# Patient Record
Sex: Male | Born: 1970 | Race: White | Hispanic: No | Marital: Single | Smoking: Current every day smoker
Health system: Southern US, Community
[De-identification: ages and names within clinical notes are randomized; demographics above are authoritative.]

## PROBLEM LIST (undated history)

## (undated) HISTORY — PX: HERNIA REPAIR: SHX51

## (undated) HISTORY — PX: APPENDECTOMY: SHX54

---

## 2002-05-28 ENCOUNTER — Encounter: Payer: Self-pay | Admitting: *Deleted

## 2002-05-28 ENCOUNTER — Emergency Department (HOSPITAL_COMMUNITY): Admission: EM | Admit: 2002-05-28 | Discharge: 2002-05-28 | Payer: Self-pay | Admitting: *Deleted

## 2002-11-25 ENCOUNTER — Ambulatory Visit (HOSPITAL_BASED_OUTPATIENT_CLINIC_OR_DEPARTMENT_OTHER): Admission: RE | Admit: 2002-11-25 | Discharge: 2002-11-25 | Payer: Self-pay | Admitting: General Surgery

## 2002-11-25 ENCOUNTER — Ambulatory Visit (HOSPITAL_COMMUNITY): Admission: RE | Admit: 2002-11-25 | Discharge: 2002-11-25 | Payer: Self-pay | Admitting: General Surgery

## 2007-10-16 ENCOUNTER — Ambulatory Visit (HOSPITAL_BASED_OUTPATIENT_CLINIC_OR_DEPARTMENT_OTHER): Admission: RE | Admit: 2007-10-16 | Discharge: 2007-10-16 | Payer: Self-pay | Admitting: Orthopedic Surgery

## 2010-07-12 NOTE — Op Note (Signed)
NAMEPREET, PERRIER NO.:  [id number]   MEDICAL RECORD NO.:  [id number]          PATIENT TYPE:  AMB   LOCATION:  DSC                          FACILITY:  [facility]   PHYSICIAN:  Cindee Salt, M.D.       DATE OF BIRTH:  Sep 13, 1970   DATE OF PROCEDURE:  DATE OF DISCHARGE:                               OPERATIVE REPORT   PREOPERATIVE DIAGNOSIS:  Paronychia felon, left thumb.   POSTOPERATIVE DIAGNOSIS:  Paronychia felon, left thumb.   OPERATION:  Incision and drainage, removal of nail plate, and  debridement left thumb.   SURGEON:  Cindee Salt, MD   ASSISTANT:  __________R.N.   ANESTHESIA:  General.   DATE OF OPERATION:  October 16, 2007.   ANESTHESIOLOGIST:  Janetta Hora. Gelene Mink, M.D.   HISTORY:  The patient is a 40 year old male who has a history of an  infection of his left thumb.  This has produced a large bulla.  He feels  that he got something under his nail while at work.  This has been going  out for approximately 6 days and increasing in size.  He has overnight  developed lymphangitis.  He has been on doxycycline and Rocephin with  little response.  He is admitted for incision and drainage and  debridement as dictated by findings.  He is well aware of risks and  complications including persistence of the infection, injury to  arteries, nerves, tendons, incomplete relief of symptoms, and dystrophy.  Preoperative area of the patient is seen.  The extremity was marked with  both the patient and surgeon.   PROCEDURE:  The patient was brought to the operating room where a  general anesthetic was carried out without difficulty under the  direction of Dr. Gelene Mink.  He was prepped using DuraPrep, supine  position, right arm free.  The limb was exsanguinated from the wrist  proximally with an Esmarch bandage.  Tourniquet was placed and the arm  was inflated to 150 mmHg.  The nail plate was removed, decompression of  approximately 10 mL of pus was immediately  encountered.  There was a  sinus tract going down into the pulp from the tip, this area was  debrided.  The bolus was debrided.  The tissue beneath showed  inflammatory response but was intact.  The area of the felon was opened  from the distal wound, this was then copiously irrigated with saline.  After cultures were taken, Vancomycin 1 gram was then given.  Following  complete irrigation, no foreign material was identified or noted.  This  was done until there was no evidence of purulent material.  The wound  was packed open with iodoform gauze in the nail fold into the felon area  and onto the dorsum.  A sterile compressive dressing and splint was  applied.  The  patient tolerated the procedure well, was taken to the recovery room for  observation in satisfactory condition.  He will be discharged home to  return to the [facility] [place] in 2 days on Vicodin, pen VK,  and doxycycline.  ______________________________  Cindee Salt, M.D.     GK/MEDQ  D:  10/16/2007  T:  10/17/2007  Job:  [id number]   cc:   Dr. Earlene Plater

## 2010-07-15 NOTE — Op Note (Signed)
NAME:  George Costa, George Costa NO.:  [id number]   MEDICAL RECORD NO.:  [id number]                   PATIENT TYPE:  AMB   LOCATION:  DSC                                  FACILITY:  [facility]   PHYSICIAN:  Gita Kudo, M.D.              DATE OF BIRTH:  1970/11/09   DATE OF PROCEDURE:  11/25/2002  DATE OF DISCHARGE:                                 OPERATIVE REPORT   OPERATIVE PROCEDURE:  Repair of bilateral direct herniae with Kugel modified  two-layer mesh.   SURGEON:  Gita Kudo, M.D.   ANESTHESIA:  General.   PREOPERATIVE DIAGNOSIS:  Bilateral inguinal herniae.   POSTOPERATIVE DIAGNOSIS:  Bilateral inguinal herniae, direct.   INDICATIONS FOR PROCEDURE:  This 40 year old male with bilateral groin  bulges.  A physical examination confirms herniae, and he comes in for an  elective repair.   FINDINGS:  The patient had large bilateral direct inguinal herniae.  He had  no injury to the cord structures or nerves.   DESCRIPTION OF PROCEDURE:  Under satisfactory general anesthesia, having  received 1.0 g of Ancef preoperatively, the patient's abdomen was prepped  and draped in a standard fashion.  The right side was approached first.  A  total of 30 mL of 0.5% Marcaine was infiltrated on that side during the  procedure.  A transverse incision was made and carried down and through the  external ring and the external oblique.  Bleeders were coagulated or tied  with #3-0 Vicryl.  The cord and its contents were mobilized and dissected  for an indirect sac, and none was found.  Good exposure was obtained with  self-retaining retractors.  The direct hernia was then circumscribed and the  sac removed.  Finger dissection was used to reduce the preperitoneal  contents, and they were held away with a moistened gauze.  The circular  portion of the Kugel mesh was anchored to Cooper's ligament with a #0  Prolene suture, and unfolded laterally and inferiorly.  The  gauze was  removed and the mesh then unfolded superiorly and medially.  A single suture  was used to one of the tabs, to the undersurface of the abdominal wall.  The  mesh lay in excellent position and extended from the pubis to the great  vessels laterally, and was under the inferior epigastrics.  The floor was  then closed over this with a running #0 Prolene suture, taking intermittent  bites of the mesh.  At the ring, the suture was tied, the ends left long,  and the ring was snug.  Then the oval portion of the mesh was anchored at  the internal ring with a #0 Prolene suture.  It was tacked around the  periphery with three #0 Prolene to the internal oblique, the soft tissue  near the pubis, and the inguinal ligament.  A slit was made to go around the  cord structures, and it was brought around the cord structures, and the  tails sutured to each other.  The wound was then lavaged with saline and  closed in layers after hemostasis was obtained with cautery.  A running #2-0  Vicryl approximated the external oblique, interrupted #2-0 Vicryl for the  deep fascia, #3-0 Vicryl for the subcutaneous, and a temporary dressing was  applied.  Then the other side was approached.  The incision, dissection, repair, and  closure were identical.  Following this, Steri-Strips approximated each  wound skin edges, and sterile dressings were applied.  There were no  complications, and the sponge and needle counts were correct.                                                 Gita Kudo, M.D.    MRL/MEDQ  D:  11/25/2002  T:  11/25/2002  Job:  [id number]

## 2013-12-07 ENCOUNTER — Emergency Department (HOSPITAL_COMMUNITY)
Admission: EM | Admit: 2013-12-07 | Discharge: 2013-12-07 | Disposition: A | Discharge status: Home / Self Care | Payer: BC Managed Care – PPO | Attending: Emergency Medicine | Admitting: Emergency Medicine

## 2013-12-07 ENCOUNTER — Encounter (HOSPITAL_COMMUNITY): Payer: Self-pay | Admitting: Emergency Medicine

## 2013-12-07 DIAGNOSIS — Z9889 Other specified postprocedural states: Secondary | ICD-10-CM | POA: Insufficient documentation

## 2013-12-07 DIAGNOSIS — R066 Hiccough: Secondary | ICD-10-CM | POA: Diagnosis present

## 2013-12-07 DIAGNOSIS — Z87891 Personal history of nicotine dependence: Secondary | ICD-10-CM | POA: Insufficient documentation

## 2013-12-07 DIAGNOSIS — Z7982 Long term (current) use of aspirin: Secondary | ICD-10-CM | POA: Diagnosis not present

## 2013-12-07 DIAGNOSIS — Z9081 Acquired absence of spleen: Secondary | ICD-10-CM | POA: Diagnosis not present

## 2013-12-07 DIAGNOSIS — R05 Cough: Secondary | ICD-10-CM | POA: Insufficient documentation

## 2013-12-07 DIAGNOSIS — K219 Gastro-esophageal reflux disease without esophagitis: Secondary | ICD-10-CM

## 2013-12-07 MED ORDER — GI COCKTAIL ~~LOC~~
30.0000 mL | Freq: Once | ORAL | Status: AC
Start: 2013-12-07 — End: 2013-12-07
  Administered 2013-12-07: 30 mL via ORAL
  Filled 2013-12-07: qty 30

## 2013-12-07 MED ORDER — ONDANSETRON 4 MG PO TBDP
4.0000 mg | ORAL_TABLET | Freq: Once | ORAL | Status: AC
  Administered 2013-12-07: 4 mg via ORAL
  Filled 2013-12-07: qty 1

## 2013-12-07 MED ORDER — PANTOPRAZOLE SODIUM 40 MG PO TBEC
40.0000 mg | DELAYED_RELEASE_TABLET | Freq: Once | ORAL | Status: AC
  Administered 2013-12-07: 40 mg via ORAL
  Filled 2013-12-07: qty 1

## 2013-12-07 MED ORDER — CHLORPROMAZINE HCL 25 MG PO TABS: 25.0000 mg | ORAL_TABLET | Freq: Three times a day (TID) | ORAL | Status: DC | PRN

## 2013-12-07 MED ORDER — CHLORPROMAZINE HCL 25 MG PO TABS
25.0000 mg | ORAL_TABLET | Freq: Once | ORAL | Status: AC
Start: 2013-12-07 — End: 2013-12-07
  Administered 2013-12-07: 25 mg via ORAL
  Filled 2013-12-07: qty 1

## 2013-12-07 NOTE — ED Notes (Signed)
Pt arrived to the ED with a complaint of hiccups and a cough.  Pt states that [place] was in [place] over the [place] and returned to the [place] yesterday am and after arriving began to hiccup.  Pt states that he also has experienced a cough for a couple of days.  Pt states hiccups have subsided after eating some peanut butter.

## 2013-12-07 NOTE — ED Notes (Signed)
MD at bedside. 

## 2013-12-07 NOTE — Discharge Instructions (Signed)
Hiccups A hiccup is the result of a sudden shortening of the muscle below your lungs (diaphragm). This movement of your diaphragm is then followed by the closing of your vocal cords, which causes the hiccup sound. Most people get the hiccups. Typically, hiccups last only a short amount of time. There are three types of hiccups:   Benign: last less than 48 hours.  Persistent: last more than 48 hours, but less than 1 month.  Intractable: last more than 1 month. A hiccup is a reflex. You cannot control reflexes. CAUSES  Causes of the hiccups can include:   Eating too much.  Drinking too much alcohol or fizzy drinks.  Eating too fast.  Eating or drinking hot and spicy foods or drinks.  Using certain medicines that have hiccupping as a side effect. Several medical conditions may also cause hiccups, including, but not limited to:  Stroke.  Gastroesophageal reflux.  Multiple sclerosis.  Traumatic brain injury.  Brain tumor.  Meningitis.  Having damage to the nerve that affects the diaphragm. Usually, though, hiccups have no apparent cause and are not the result of a serious medical condition. DIAGNOSIS  Tests may be performed to diagnose a possible condition associated with persistent or intractable hiccups. TREATMENT  Most cases of the hiccups need no treatment. None of the numerous home remedies have been proven to be effective. If your hiccups do require treatment, your treatment may include:  Medicine. Medicine may be given intravenously (by IV) or by mouth.  Hypnosis or acupuncture.  Surgery to the nerve that affects the diaphragm may be tried in severe cases. If your hiccups are caused by an underlying medical condition, treatment for the medical condition may be necessary.  HOME CARE INSTRUCTIONS   Eat small meals.  Limit alcohol intake to no more than 1 drink per day for nonpregnant women and 2 drinks per day for men. One drink equals 12 ounces of beer, 5 ounces  of wine, or 1 ounces of hard liquor.  Limit drinking fizzy drinks.  Eat and chew your food slowly.  Take medicines only as directed by your health care provider. SEEK MEDICAL CARE IF:   Your hiccups last for more than 48 hours.  You are given medicine, but your hiccups do not get better.  You cannot sleep or eat due to the hiccups.  You have unexpected weight loss due to the hiccups.  You have trouble breathing or swallowing.  You have a fever.  You develop severe pain in your abdomen.  You develop numbness, tingling, or weakness. Document Released: 04/24/2001 Document Revised: 06/30/2013 Document Reviewed: 04/06/2010 [facility] Patient Information 2015 [place], [place]. This information is not intended to replace advice given to you by your health care provider. Make sure you discuss any questions you have with your health care provider.  Gastroesophageal Reflux Disease, Adult Gastroesophageal reflux disease (GERD) happens when acid from your stomach flows up into the esophagus. When acid comes in contact with the esophagus, the acid causes soreness (inflammation) in the esophagus. Over time, GERD may create small holes (ulcers) in the lining of the esophagus. CAUSES   Increased body weight. This puts pressure on the stomach, making acid rise from the stomach into the esophagus.  Smoking. This increases acid production in the stomach.  Drinking alcohol. This causes decreased pressure in the lower esophageal sphincter (valve or ring of muscle between the esophagus and stomach), allowing acid from the stomach into the esophagus.  Late evening meals and a full stomach.  This increases pressure and acid production in the stomach.  A malformed lower esophageal sphincter. Sometimes, no cause is found. SYMPTOMS   Burning pain in the lower part of the mid-chest behind the breastbone and in the mid-stomach area. This may occur twice a week or more often.  Trouble swallowing.  Sore  throat.  Dry cough.  Asthma-like symptoms including chest tightness, shortness of breath, or wheezing. DIAGNOSIS  Your caregiver may be able to diagnose GERD based on your symptoms. In some cases, X-rays and other tests may be done to check for complications or to check the condition of your stomach and esophagus. TREATMENT  Your caregiver may recommend over-the-counter or prescription medicines to help decrease acid production. Ask your caregiver before starting or adding any new medicines.  HOME CARE INSTRUCTIONS   Change the factors that you can control. Ask your caregiver for guidance concerning weight loss, quitting smoking, and alcohol consumption.  Avoid foods and drinks that make your symptoms worse, such as:  Caffeine or alcoholic drinks.  Chocolate.  Peppermint or mint flavorings.  Garlic and onions.  Spicy foods.  Citrus fruits, such as oranges, lemons, or limes.  Tomato-based foods such as sauce, chili, salsa, and pizza.  Fried and fatty foods.  Avoid lying down for the 3 hours prior to your bedtime or prior to taking a nap.  Eat small, frequent meals instead of large meals.  Wear loose-fitting clothing. Do not wear anything tight around your waist that causes pressure on your stomach.  Raise the head of your bed 6 to 8 inches with wood blocks to help you sleep. Extra pillows will not help.  Only take over-the-counter or prescription medicines for pain, discomfort, or fever as directed by your caregiver.  Do not take aspirin, ibuprofen, or other nonsteroidal anti-inflammatory drugs (NSAIDs). SEEK IMMEDIATE MEDICAL CARE IF:   You have pain in your arms, neck, jaw, teeth, or back.  Your pain increases or changes in intensity or duration.  You develop nausea, vomiting, or sweating (diaphoresis).  You develop shortness of breath, or you faint.  Your vomit is green, yellow, black, or looks like coffee grounds or blood.  Your stool is red, bloody, or  black. These symptoms could be signs of other problems, such as heart disease, gastric bleeding, or esophageal bleeding. MAKE SURE YOU:   Understand these instructions.  Will watch your condition.  Will get help right away if you are not doing well or get worse. Document Released: 11/23/2004 Document Revised: 05/08/2011 Document Reviewed: 09/02/2010 [facility] Patient Information 2015 [place], [place]. This information is not intended to replace advice given to you by your health care provider. Make sure you discuss any questions you have with your health care provider.

## 2013-12-07 NOTE — ED Provider Notes (Signed)
CSN: [id number]     Arrival date & time 12/07/13  0009 History   First MD Initiated Contact with Patient 12/07/13 0204     Chief Complaint  Patient presents with  . Cough  . Hiccups     (Consider location/radiation/quality/duration/timing/severity/associated sxs/prior Treatment) HPI  This a 43 year old male with history of reflux who presents with hiccups and cough. Patient reports onset of symptoms and worsening since this morning. Patient states that he ate breakfast and felt like breakfast got stuck. Since that time he has had hiccups that come and go. He reports that he is unable to sleep because of the hiccups. He also reports worsening acid reflux symptoms including nausea.  The patient returned from [place]. He reports that since yesterday he has had a nonproductive cough. Denies any fevers, chest pain, shortness of breath.  History reviewed. No pertinent past medical history. Past Surgical History  Procedure Laterality Date  . Appendectomy    . Hernia repair     History reviewed. No pertinent family history. History  Substance Use Topics  . Smoking status: Former Games developermoker  . Smokeless tobacco: Never Used  . Alcohol Use: Yes    Review of Systems  Constitutional: Negative.  Negative for fever.  Respiratory: Positive for cough. Negative for chest tightness and shortness of breath.   Cardiovascular: Negative.  Negative for chest pain.  Gastrointestinal: Positive for nausea. Negative for abdominal pain and diarrhea.  Genitourinary: Negative.  Negative for dysuria.  Neurological: Negative for headaches.  All other systems reviewed and are negative.     Allergies  Review of patient's allergies indicates no known allergies.  Home Medications   Prior to Admission medications   Medication Sig Start Date End Date Taking? Authorizing Provider  testosterone cypionate (DEPOTESTOTERONE CYPIONATE) 100 MG/ML injection Inject 100 mg into the muscle every 7 (seven) days. For IM  use only   Yes Historical Provider, MD  chlorproMAZINE (THORAZINE) 25 MG tablet Take 1 tablet (25 mg total) by mouth 3 (three) times daily as needed for hiccoughs. 12/07/13   Shon Batonourtney F Horton, MD   BP 128/79  Pulse 83  Temp(Src) 98.6 F (37 C) (Oral)  Resp 20  SpO2 98% Physical Exam  Nursing note and vitals reviewed. Constitutional: He is oriented to person, place, and time. He appears well-developed and well-nourished. No distress.  HENT:  Head: Normocephalic and atraumatic.  Cardiovascular: Normal rate, regular rhythm and normal heart sounds.   No murmur heard. Pulmonary/Chest: Effort normal and breath sounds normal. No respiratory distress. He has no wheezes.  Abdominal: Soft. Bowel sounds are normal. There is no tenderness. There is no rebound and no guarding.  Musculoskeletal: He exhibits no edema.  Neurological: He is alert and oriented to person, place, and time.  Skin: Skin is warm and dry.  Psychiatric: He has a normal mood and affect.    ED Course  Procedures (including critical care time) Labs Review Labs Reviewed - No data to display  Imaging Review No results found.   EKG Interpretation None      MDM   Final diagnoses:  Hiccups  Gastroesophageal reflux disease, esophagitis presence not specified    Patient presents with hiccups, worsening GERD symptoms, and cough. He is nontoxic on exam. He is noted to be hiccuping. Patient was given Thorazine. He was also given a dose of oral Protonix and a GI cocktail. Patient does not have any fever or shortness of breath and has clear lung sounds. Low suspicion the cough is  related to pneumonia or other pulmonary pathology. May be related to worsening GERD symptoms. On recheck, patient reports improvement of symptoms. Discussed with patient continuing over-the-counter PPI at home. He will also be given Thorazine 3 times a day when necessary for hiccups.  After history, exam, and medical workup I feel the patient has  been appropriately medically screened and is safe for discharge home. Pertinent diagnoses were discussed with the patient. Patient was given return precautions.     Shon Batonourtney F Horton, MD 12/07/13 [phone]

## 2015-03-18 ENCOUNTER — Encounter (HOSPITAL_BASED_OUTPATIENT_CLINIC_OR_DEPARTMENT_OTHER): Payer: Self-pay | Admitting: Emergency Medicine

## 2015-03-18 ENCOUNTER — Emergency Department (HOSPITAL_BASED_OUTPATIENT_CLINIC_OR_DEPARTMENT_OTHER)
Admission: EM | Admit: 2015-03-18 | Discharge: 2015-03-18 | Disposition: A | Discharge status: Home / Self Care | Payer: BLUE CROSS/BLUE SHIELD | Attending: Emergency Medicine | Admitting: Emergency Medicine

## 2015-03-18 ENCOUNTER — Emergency Department (HOSPITAL_BASED_OUTPATIENT_CLINIC_OR_DEPARTMENT_OTHER): Payer: BLUE CROSS/BLUE SHIELD

## 2015-03-18 DIAGNOSIS — F17200 Nicotine dependence, unspecified, uncomplicated: Secondary | ICD-10-CM | POA: Diagnosis not present

## 2015-03-18 DIAGNOSIS — Z9049 Acquired absence of other specified parts of digestive tract: Secondary | ICD-10-CM | POA: Insufficient documentation

## 2015-03-18 DIAGNOSIS — R066 Hiccough: Secondary | ICD-10-CM | POA: Insufficient documentation

## 2015-03-18 DIAGNOSIS — K219 Gastro-esophageal reflux disease without esophagitis: Secondary | ICD-10-CM | POA: Insufficient documentation

## 2015-03-18 DIAGNOSIS — Z9889 Other specified postprocedural states: Secondary | ICD-10-CM | POA: Insufficient documentation

## 2015-03-18 DIAGNOSIS — R05 Cough: Secondary | ICD-10-CM | POA: Diagnosis present

## 2015-03-18 MED ORDER — PANTOPRAZOLE SODIUM 40 MG PO TBEC
40.0000 mg | DELAYED_RELEASE_TABLET | Freq: Once | ORAL | Status: AC
  Administered 2015-03-18: 40 mg via ORAL
  Filled 2015-03-18: qty 1

## 2015-03-18 MED ORDER — CHLORPROMAZINE HCL 10 MG PO TABS: 10.0000 mg | ORAL_TABLET | Freq: Three times a day (TID) | ORAL | Status: DC | PRN

## 2015-03-18 MED ORDER — BENZONATATE 100 MG PO CAPS
200.0000 mg | ORAL_CAPSULE | Freq: Once | ORAL | Status: AC
  Administered 2015-03-18: 200 mg via ORAL
  Filled 2015-03-18: qty 2

## 2015-03-18 MED ORDER — GI COCKTAIL ~~LOC~~
30.0000 mL | Freq: Once | ORAL | Status: AC
  Administered 2015-03-18: 30 mL via ORAL
  Filled 2015-03-18: qty 30

## 2015-03-18 NOTE — ED Notes (Signed)
to xray

## 2015-03-18 NOTE — ED Notes (Signed)
Return from xray

## 2015-03-18 NOTE — ED Provider Notes (Signed)
CSN: [id number]     Arrival date & time 03/18/15  0334 History   None    Chief Complaint  Patient presents with  . Cough     (Consider location/radiation/quality/duration/timing/severity/associated sxs/prior Treatment) Patient is a 45 y.o. male presenting with cough. The history is provided by the patient.  Cough Cough characteristics: hiccups not cough. Severity:  Moderate Onset quality:  Gradual Timing:  Constant Progression:  Unchanged Chronicity:  Recurrent Smoker: yes   Context: not with activity   Relieved by:  None tried Worsened by:  Nothing tried Ineffective treatments:  None tried Associated symptoms: no sore throat, no weight loss and no wheezing   Risk factors: no recent travel   Symptoms started post chili dogs and tom yum soup  History reviewed. No pertinent past medical history. Past Surgical History  Procedure Laterality Date  . Appendectomy    . Hernia repair     History reviewed. No pertinent family history. Social History  Substance Use Topics  . Smoking status: Current Every Day Smoker  . Smokeless tobacco: Never Used  . Alcohol Use: Yes    Review of Systems  Constitutional: Negative for weight loss.  HENT: Negative for congestion, drooling and sore throat.   Respiratory: Positive for cough. Negative for wheezing.   All other systems reviewed and are negative.     Allergies  Review of patient's allergies indicates no known allergies.  Home Medications   Prior to Admission medications   Medication Sig Start Date End Date Taking? Authorizing Provider  chlorproMAZINE (THORAZINE) 25 MG tablet Take 1 tablet (25 mg total) by mouth 3 (three) times daily as needed for hiccoughs. 12/07/13   Shon Baton, MD  testosterone cypionate (DEPOTESTOTERONE CYPIONATE) 100 MG/ML injection Inject 100 mg into the muscle every 7 (seven) days. For IM use only    Historical Provider, MD   BP 143/98 mmHg  Pulse 78  Temp(Src) 97.5 F (36.4 C) (Oral)  Resp  18  SpO2 98% Physical Exam  Constitutional: He is oriented to person, place, and time. He appears well-developed and well-nourished. No distress.  HENT:  Head: Normocephalic and atraumatic.  Mouth/Throat: Oropharynx is clear and moist.  Eyes: Conjunctivae are normal. Pupils are equal, round, and reactive to light.  Neck: Normal range of motion. Neck supple.  Cardiovascular: Normal rate, regular rhythm and intact distal pulses.   Pulmonary/Chest: Effort normal and breath sounds normal. No stridor. No respiratory distress. He has no wheezes. He has no rales.  Abdominal: Soft. Bowel sounds are increased. There is no tenderness. There is no rebound, no guarding, no tenderness at McBurney's point and negative Murphy's sign.  Musculoskeletal: Normal range of motion.  Neurological: He is alert and oriented to person, place, and time.  Skin: Skin is warm and dry.  Psychiatric: He has a normal mood and affect.    ED Course  Procedures (including critical care time) Labs Review Labs Reviewed - No data to display  Imaging Review No results found. I have personally reviewed and evaluated these images and lab results as part of my medical decision-making.   EKG Interpretation None      MDM   Final diagnoses:  None    Patient drove to the ED.  Cannot get thorazine and dry due to sedation.  Will write RX for same.     Cy Blamer, MD 03/18/15 [phone]

## 2015-06-08 ENCOUNTER — Ambulatory Visit
Admission: RE | Admit: 2015-06-08 | Discharge: 2015-06-08 | Disposition: A | Discharge status: Home / Self Care | Payer: BLUE CROSS/BLUE SHIELD | Source: Ambulatory Visit | Attending: Family | Admitting: Family

## 2015-06-08 ENCOUNTER — Other Ambulatory Visit: Payer: Self-pay | Admitting: Family

## 2015-06-08 DIAGNOSIS — R059 Cough, unspecified: Secondary | ICD-10-CM

## 2015-06-08 DIAGNOSIS — R05 Cough: Secondary | ICD-10-CM

## 2015-06-08 DIAGNOSIS — R0989 Other specified symptoms and signs involving the circulatory and respiratory systems: Secondary | ICD-10-CM

## 2017-03-24 ENCOUNTER — Other Ambulatory Visit: Payer: Self-pay | Admitting: Orthopedic Surgery

## 2017-03-24 DIAGNOSIS — M25511 Pain in right shoulder: Secondary | ICD-10-CM

## 2017-04-08 ENCOUNTER — Ambulatory Visit
Admission: RE | Admit: 2017-04-08 | Discharge: 2017-04-08 | Disposition: A | Discharge status: Home / Self Care | Payer: 59 | Source: Ambulatory Visit | Attending: Orthopedic Surgery | Admitting: Orthopedic Surgery

## 2017-04-08 DIAGNOSIS — M25511 Pain in right shoulder: Secondary | ICD-10-CM

## 2017-08-01 ENCOUNTER — Inpatient Hospital Stay (HOSPITAL_COMMUNITY): Payer: No Typology Code available for payment source

## 2017-08-01 ENCOUNTER — Other Ambulatory Visit: Payer: Self-pay

## 2017-08-01 ENCOUNTER — Inpatient Hospital Stay (HOSPITAL_COMMUNITY)
Admission: EM | Admit: 2017-08-01 | Discharge: 2017-08-27 | DRG: 091 | Disposition: E | Payer: No Typology Code available for payment source | Attending: Emergency Medicine | Admitting: Emergency Medicine

## 2017-08-01 ENCOUNTER — Encounter (HOSPITAL_COMMUNITY): Payer: Self-pay

## 2017-08-01 ENCOUNTER — Emergency Department (HOSPITAL_COMMUNITY): Payer: No Typology Code available for payment source

## 2017-08-01 DIAGNOSIS — I959 Hypotension, unspecified: Secondary | ICD-10-CM | POA: Diagnosis present

## 2017-08-01 DIAGNOSIS — R7881 Bacteremia: Secondary | ICD-10-CM | POA: Diagnosis present

## 2017-08-01 DIAGNOSIS — R402112 Coma scale, eyes open, never, at arrival to emergency department: Secondary | ICD-10-CM | POA: Diagnosis present

## 2017-08-01 DIAGNOSIS — G931 Anoxic brain damage, not elsewhere classified: Principal | ICD-10-CM | POA: Diagnosis present

## 2017-08-01 DIAGNOSIS — I214 Non-ST elevation (NSTEMI) myocardial infarction: Secondary | ICD-10-CM | POA: Diagnosis present

## 2017-08-01 DIAGNOSIS — E8729 Other acidosis: Secondary | ICD-10-CM | POA: Diagnosis present

## 2017-08-01 DIAGNOSIS — R569 Unspecified convulsions: Secondary | ICD-10-CM

## 2017-08-01 DIAGNOSIS — T401X1A Poisoning by heroin, accidental (unintentional), initial encounter: Secondary | ICD-10-CM | POA: Diagnosis present

## 2017-08-01 DIAGNOSIS — R579 Shock, unspecified: Secondary | ICD-10-CM

## 2017-08-01 DIAGNOSIS — E872 Acidosis: Secondary | ICD-10-CM | POA: Diagnosis present

## 2017-08-01 DIAGNOSIS — R570 Cardiogenic shock: Secondary | ICD-10-CM | POA: Diagnosis present

## 2017-08-01 DIAGNOSIS — R6521 Severe sepsis with septic shock: Secondary | ICD-10-CM | POA: Diagnosis present

## 2017-08-01 DIAGNOSIS — R402312 Coma scale, best motor response, none, at arrival to emergency department: Secondary | ICD-10-CM | POA: Diagnosis present

## 2017-08-01 DIAGNOSIS — R402212 Coma scale, best verbal response, none, at arrival to emergency department: Secondary | ICD-10-CM | POA: Diagnosis present

## 2017-08-01 DIAGNOSIS — J80 Acute respiratory distress syndrome: Secondary | ICD-10-CM | POA: Diagnosis not present

## 2017-08-01 DIAGNOSIS — I469 Cardiac arrest, cause unspecified: Secondary | ICD-10-CM | POA: Diagnosis not present

## 2017-08-01 DIAGNOSIS — N179 Acute kidney failure, unspecified: Secondary | ICD-10-CM | POA: Diagnosis present

## 2017-08-01 DIAGNOSIS — T401X4A Poisoning by heroin, undetermined, initial encounter: Secondary | ICD-10-CM | POA: Diagnosis not present

## 2017-08-01 DIAGNOSIS — A419 Sepsis, unspecified organism: Secondary | ICD-10-CM

## 2017-08-01 DIAGNOSIS — G40101 Localization-related (focal) (partial) symptomatic epilepsy and epileptic syndromes with simple partial seizures, not intractable, with status epilepticus: Secondary | ICD-10-CM | POA: Diagnosis present

## 2017-08-01 DIAGNOSIS — F17200 Nicotine dependence, unspecified, uncomplicated: Secondary | ICD-10-CM | POA: Diagnosis present

## 2017-08-01 DIAGNOSIS — A403 Sepsis due to Streptococcus pneumoniae: Secondary | ICD-10-CM | POA: Diagnosis present

## 2017-08-01 DIAGNOSIS — J9601 Acute respiratory failure with hypoxia: Secondary | ICD-10-CM | POA: Diagnosis not present

## 2017-08-01 DIAGNOSIS — J690 Pneumonitis due to inhalation of food and vomit: Secondary | ICD-10-CM | POA: Diagnosis present

## 2017-08-01 DIAGNOSIS — Z9289 Personal history of other medical treatment: Secondary | ICD-10-CM

## 2017-08-01 DIAGNOSIS — J9600 Acute respiratory failure, unspecified whether with hypoxia or hypercapnia: Secondary | ICD-10-CM | POA: Diagnosis present

## 2017-08-01 DIAGNOSIS — F1910 Other psychoactive substance abuse, uncomplicated: Secondary | ICD-10-CM | POA: Diagnosis present

## 2017-08-01 LAB — POCT I-STAT 3, ART BLOOD GAS (G3+)
Acid-base deficit: 7 mmol/L — ABNORMAL HIGH (ref 0.0–2.0)
Acid-base deficit: 2 mmol/L (ref 0.0–2.0)
BICARBONATE: 22.7 mmol/L (ref 20.0–28.0)
BICARBONATE: 25.9 mmol/L (ref 20.0–28.0)
BICARBONATE: 26.7 mmol/L (ref 20.0–28.0)
O2 Saturation: 70 %
O2 Saturation: 73 %
O2 Saturation: 75 %
PCO2 ART: 54.8 mmHg — AB (ref 32.0–48.0)
PCO2 ART: 49.5 mmHg — AB (ref 32.0–48.0)
PCO2 ART: 49.2 mmHg — AB (ref 32.0–48.0)
PH ART: 7.313 — AB (ref 7.350–7.450)
PH ART: 7.335 — AB (ref 7.350–7.450)
PO2 ART: 36 mmHg — AB (ref 83.0–108.0)
PO2 ART: 37 mmHg — AB (ref 83.0–108.0)
PO2 ART: 39 mmHg — AB (ref 83.0–108.0)
Patient temperature: 33
Patient temperature: 34.4
Patient temperature: 35.5
TCO2: 25 mmol/L (ref 22–32)
TCO2: 28 mmol/L (ref 22–32)
TCO2: 28 mmol/L (ref 22–32)
pH, Arterial: 7.202 — ABNORMAL LOW (ref 7.350–7.450)

## 2017-08-01 LAB — URINALYSIS, ROUTINE W REFLEX MICROSCOPIC
BILIRUBIN URINE: NEGATIVE
Glucose, UA: 50 mg/dL — AB
Ketones, ur: NEGATIVE mg/dL
LEUKOCYTES UA: NEGATIVE
Nitrite: NEGATIVE
PROTEIN: 100 mg/dL — AB
SPECIFIC GRAVITY, URINE: 1.015 (ref 1.005–1.030)
pH: 5 (ref 5.0–8.0)

## 2017-08-01 LAB — RAPID URINE DRUG SCREEN, HOSP PERFORMED
Amphetamines: NOT DETECTED
BARBITURATES: NOT DETECTED
BENZODIAZEPINES: NOT DETECTED
COCAINE: POSITIVE — AB
OPIATES: POSITIVE — AB
TETRAHYDROCANNABINOL: POSITIVE — AB

## 2017-08-01 LAB — I-STAT ARTERIAL BLOOD GAS, ED
ACID-BASE DEFICIT: 20 mmol/L — AB (ref 0.0–2.0)
Acid-base deficit: 9 mmol/L — ABNORMAL HIGH (ref 0.0–2.0)
BICARBONATE: 20.3 mmol/L (ref 20.0–28.0)
Bicarbonate: 10.6 mmol/L — ABNORMAL LOW (ref 20.0–28.0)
O2 SAT: 99 %
O2 Saturation: 76 %
PH ART: 7.06 — AB (ref 7.350–7.450)
TCO2: 12 mmol/L — ABNORMAL LOW (ref 22–32)
TCO2: 22 mmol/L (ref 22–32)
pCO2 arterial: 35.1 mmHg (ref 32.0–48.0)
pCO2 arterial: 47.6 mmHg (ref 32.0–48.0)
pH, Arterial: 7.213 — ABNORMAL LOW (ref 7.350–7.450)
pO2, Arterial: 181 mmHg — ABNORMAL HIGH (ref 83.0–108.0)
pO2, Arterial: 40 mmHg — CL (ref 83.0–108.0)

## 2017-08-01 LAB — GLUCOSE, CAPILLARY
GLUCOSE-CAPILLARY: 94 mg/dL (ref 65–99)
Glucose-Capillary: 168 mg/dL — ABNORMAL HIGH (ref 65–99)

## 2017-08-01 LAB — BASIC METABOLIC PANEL
ANION GAP: 22 — AB (ref 5–15)
BUN: 37 mg/dL — ABNORMAL HIGH (ref 6–20)
CO2: 18 mmol/L — AB (ref 22–32)
Calcium: 7.6 mg/dL — ABNORMAL LOW (ref 8.9–10.3)
Chloride: 104 mmol/L (ref 101–111)
Creatinine, Ser: 4.86 mg/dL — ABNORMAL HIGH (ref 0.61–1.24)
GFR calc Af Amer: 15 mL/min — ABNORMAL LOW (ref >60)
GFR, EST NON AFRICAN AMERICAN: 13 mL/min — AB (ref >60)
GLUCOSE: 85 mg/dL (ref 65–99)
POTASSIUM: 4.5 mmol/L (ref 3.5–5.1)
Sodium: 144 mmol/L (ref 135–145)

## 2017-08-01 LAB — CBC
HCT: 55.5 % — ABNORMAL HIGH (ref 39.0–52.0)
Hemoglobin: 17.2 g/dL — ABNORMAL HIGH (ref 13.0–17.0)
MCH: 30.3 pg (ref 26.0–34.0)
MCHC: 31 g/dL (ref 30.0–36.0)
MCV: 97.9 fL (ref 78.0–100.0)
PLATELETS: 231 10*3/uL (ref 150–400)
RBC: 5.67 MIL/uL (ref 4.22–5.81)
RDW: 12.4 % (ref 11.5–15.5)
WBC: 7.3 10*3/uL (ref 4.0–10.5)

## 2017-08-01 LAB — I-STAT CG4 LACTIC ACID, ED: Lactic Acid, Venous: 12.29 mmol/L (ref 0.5–1.9)

## 2017-08-01 LAB — I-STAT TROPONIN, ED: TROPONIN I, POC: 3.65 ng/mL — AB (ref 0.00–0.08)

## 2017-08-01 LAB — TROPONIN I
Troponin I: 3.03 ng/mL (ref <0.03)
Troponin I: 20.44 ng/mL (ref <0.03)

## 2017-08-01 LAB — LACTIC ACID, PLASMA
LACTIC ACID, VENOUS: 5.5 mmol/L — AB (ref 0.5–1.9)
Lactic Acid, Venous: 7.8 mmol/L (ref 0.5–1.9)

## 2017-08-01 MED ORDER — NOREPINEPHRINE 4 MG/250ML-% IV SOLN
0.0000 ug/min | INTRAVENOUS | Status: DC
  Administered 2017-08-01: 30 ug/min via INTRAVENOUS
  Administered 2017-08-01: 40 ug/min via INTRAVENOUS
  Administered 2017-08-01 (×2): 50 ug/min via INTRAVENOUS
  Administered 2017-08-01: 42 ug/min via INTRAVENOUS
  Filled 2017-08-01 (×8): qty 250

## 2017-08-01 MED ORDER — ORAL CARE MOUTH RINSE
15.0000 mL | OROMUCOSAL | Status: DC
  Administered 2017-08-01 – 2017-08-02 (×8): 15 mL via OROMUCOSAL

## 2017-08-01 MED ORDER — SODIUM CHLORIDE 0.9 % IV SOLN: INTRAVENOUS | Status: DC

## 2017-08-01 MED ORDER — SODIUM BICARBONATE 8.4 % IV SOLN
INTRAVENOUS | Status: AC
  Filled 2017-08-01: qty 100

## 2017-08-01 MED ORDER — CHLORHEXIDINE GLUCONATE 0.12% ORAL RINSE (MEDLINE KIT): 15.0000 mL | Freq: Two times a day (BID) | OROMUCOSAL | Status: DC

## 2017-08-01 MED ORDER — PHENYLEPHRINE HCL-NACL 10-0.9 MG/250ML-% IV SOLN
0.0000 ug/min | INTRAVENOUS | Status: DC
  Administered 2017-08-01: 100 ug/min via INTRAVENOUS
  Filled 2017-08-01 (×4): qty 250

## 2017-08-01 MED ORDER — FENTANYL CITRATE (PF) 100 MCG/2ML IJ SOLN
100.0000 ug | INTRAMUSCULAR | Status: DC | PRN
  Administered 2017-08-01: 100 ug via INTRAVENOUS

## 2017-08-01 MED ORDER — SODIUM BICARBONATE 8.4 % IV SOLN
INTRAVENOUS | Status: AC
  Administered 2017-08-01: 150 meq via INTRAVENOUS
  Filled 2017-08-01: qty 100

## 2017-08-01 MED ORDER — FAMOTIDINE IN NACL 20-0.9 MG/50ML-% IV SOLN
20.0000 mg | Freq: Two times a day (BID) | INTRAVENOUS | Status: DC
  Administered 2017-08-01 – 2017-08-02 (×3): 20 mg via INTRAVENOUS
  Filled 2017-08-01 (×4): qty 50

## 2017-08-01 MED ORDER — VASOPRESSIN 20 UNIT/ML IV SOLN
0.0400 [IU]/min | INTRAVENOUS | Status: DC
  Administered 2017-08-01 – 2017-08-02 (×3): 0.04 [IU]/min via INTRAVENOUS
  Filled 2017-08-01 (×2): qty 2

## 2017-08-01 MED ORDER — SODIUM CHLORIDE 0.9 % IV SOLN
INTRAVENOUS | Status: AC | PRN
  Administered 2017-08-01 (×2): 1000 mL via INTRAVENOUS

## 2017-08-01 MED ORDER — SODIUM BICARBONATE 8.4 % IV SOLN
INTRAVENOUS | Status: AC
  Filled 2017-08-01: qty 150

## 2017-08-01 MED ORDER — MIDAZOLAM HCL 2 MG/2ML IJ SOLN
2.0000 mg | INTRAMUSCULAR | Status: DC | PRN
  Administered 2017-08-01: 2 mg via INTRAVENOUS

## 2017-08-01 MED ORDER — SODIUM CHLORIDE 0.9 % IV SOLN
INTRAVENOUS | Status: DC | PRN
Start: 2017-08-01 — End: 2017-08-02

## 2017-08-01 MED ORDER — NOREPINEPHRINE 16 MG/250ML-% IV SOLN
0.0000 ug/min | INTRAVENOUS | Status: DC
  Administered 2017-08-01: 35 ug/min via INTRAVENOUS
  Administered 2017-08-02: 50 ug/min via INTRAVENOUS
  Administered 2017-08-02: 42 ug/min via INTRAVENOUS
  Filled 2017-08-01 (×5): qty 250

## 2017-08-01 MED ORDER — SODIUM CHLORIDE 0.9 % IV SOLN: 250.0000 mL | INTRAVENOUS | Status: DC | PRN

## 2017-08-01 MED ORDER — DOPAMINE-DEXTROSE 3.2-5 MG/ML-% IV SOLN
0.0000 ug/kg/min | INTRAVENOUS | Status: DC
  Administered 2017-08-01: 5 ug/kg/min via INTRAVENOUS
  Administered 2017-08-01: 15 ug/kg/min via INTRAVENOUS
  Administered 2017-08-02: 20 ug/kg/min via INTRAVENOUS
  Filled 2017-08-01 (×3): qty 250

## 2017-08-01 MED ORDER — SODIUM BICARBONATE 8.4 % IV SOLN
INTRAVENOUS | Status: DC | PRN
  Administered 2017-08-01: 150 meq via INTRAVENOUS

## 2017-08-01 MED ORDER — SODIUM BICARBONATE 8.4 % IV SOLN
INTRAVENOUS | Status: AC
  Filled 2017-08-01: qty 50

## 2017-08-01 MED ORDER — STERILE WATER FOR INJECTION IV SOLN
INTRAVENOUS | Status: DC
  Administered 2017-08-01 – 2017-08-02 (×4): via INTRAVENOUS
  Filled 2017-08-01 (×8): qty 850

## 2017-08-01 MED ORDER — PIPERACILLIN-TAZOBACTAM 3.375 G IVPB
3.3750 g | Freq: Three times a day (TID) | INTRAVENOUS | Status: DC
  Administered 2017-08-01 – 2017-08-02 (×4): 3.375 g via INTRAVENOUS
  Filled 2017-08-01 (×4): qty 50

## 2017-08-01 MED ORDER — SODIUM BICARBONATE 8.4 % IV SOLN
150.0000 meq | Freq: Once | INTRAVENOUS | Status: AC
  Administered 2017-08-01 (×2): 150 meq via INTRAVENOUS

## 2017-08-01 MED ORDER — VANCOMYCIN HCL 10 G IV SOLR
1500.0000 mg | Freq: Once | INTRAVENOUS | Status: AC
  Administered 2017-08-01: 1500 mg via INTRAVENOUS
  Filled 2017-08-01 (×3): qty 1500

## 2017-08-01 MED ORDER — HEPARIN SODIUM (PORCINE) 5000 UNIT/ML IJ SOLN: 5000.0000 [IU] | Freq: Three times a day (TID) | INTRAMUSCULAR | Status: DC

## 2017-08-01 MED ORDER — PHENYLEPHRINE HCL 10 MG/ML IJ SOLN
INTRAMUSCULAR | Status: AC | PRN
  Administered 2017-08-01: 80 ug
  Administered 2017-08-01: 20 ug
  Administered 2017-08-01: 40 ug
  Administered 2017-08-01: 30 ug

## 2017-08-01 MED ORDER — PHENYLEPHRINE 40 MCG/ML (10ML) SYRINGE FOR IV PUSH (FOR BLOOD PRESSURE SUPPORT)
PREFILLED_SYRINGE | INTRAVENOUS | Status: AC
  Filled 2017-08-01: qty 10

## 2017-08-01 MED ORDER — SODIUM CHLORIDE 0.9 % IV SOLN
0.0000 ug/min | INTRAVENOUS | Status: DC
  Administered 2017-08-01: 200 ug/min via INTRAVENOUS
  Administered 2017-08-01: 400 ug/min via INTRAVENOUS
  Administered 2017-08-01: 300 ug/min via INTRAVENOUS
  Administered 2017-08-01: 200 ug/min via INTRAVENOUS
  Administered 2017-08-01 – 2017-08-02 (×7): 400 ug/min via INTRAVENOUS
  Filled 2017-08-01: qty 40
  Filled 2017-08-01: qty 4
  Filled 2017-08-01: qty 40
  Filled 2017-08-01 (×2): qty 4
  Filled 2017-08-01: qty 40
  Filled 2017-08-01: qty 4
  Filled 2017-08-01 (×3): qty 40
  Filled 2017-08-01: qty 4
  Filled 2017-08-01 (×4): qty 40

## 2017-08-01 MED ORDER — FENTANYL CITRATE (PF) 100 MCG/2ML IJ SOLN
100.0000 ug | INTRAMUSCULAR | Status: DC | PRN
  Filled 2017-08-01: qty 2

## 2017-08-01 MED ORDER — PIPERACILLIN-TAZOBACTAM 3.375 G IVPB 30 MIN
3.3750 g | Freq: Once | INTRAVENOUS | Status: AC
  Administered 2017-08-01: 3.375 g via INTRAVENOUS
  Filled 2017-08-01: qty 50

## 2017-08-01 MED ORDER — CHLORHEXIDINE GLUCONATE 0.12% ORAL RINSE (MEDLINE KIT)
15.0000 mL | Freq: Two times a day (BID) | OROMUCOSAL | Status: DC
  Administered 2017-08-01 – 2017-08-02 (×2): 15 mL via OROMUCOSAL

## 2017-08-01 MED ORDER — ORAL CARE MOUTH RINSE
15.0000 mL | OROMUCOSAL | Status: DC
  Administered 2017-08-01 (×2): 15 mL via OROMUCOSAL

## 2017-08-01 MED ORDER — SODIUM BICARBONATE 8.4 % IV SOLN
INTRAVENOUS | Status: AC
  Administered 2017-08-01: 50 meq
  Filled 2017-08-01: qty 50

## 2017-08-01 MED ORDER — MIDAZOLAM HCL 50 MG/10ML IJ SOLN
5.0000 mg/h | INTRAMUSCULAR | Status: DC
  Administered 2017-08-01: 1 mg/h via INTRAVENOUS
  Filled 2017-08-01 (×2): qty 10

## 2017-08-01 MED ORDER — DEXTROSE 5 % IV SOLN: 0.0000 ug/min | Freq: Once | INTRAVENOUS | Status: DC

## 2017-08-01 MED ORDER — MIDAZOLAM HCL 2 MG/2ML IJ SOLN
2.0000 mg | INTRAMUSCULAR | Status: DC | PRN
  Filled 2017-08-01: qty 2

## 2017-08-01 MED ORDER — LEVETIRACETAM IN NACL 1000 MG/100ML IV SOLN
1000.0000 mg | Freq: Two times a day (BID) | INTRAVENOUS | Status: DC
  Administered 2017-08-01 – 2017-08-02 (×2): 1000 mg via INTRAVENOUS
  Filled 2017-08-01 (×3): qty 100

## 2017-08-01 NOTE — Progress Notes (Signed)
ABG results reported to MD, no changes at this time.

## 2017-08-01 NOTE — Progress Notes (Signed)
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 Previously with limited myoclonic jerking but now has generalized tonic/ conic type jerking with concern for status epilepticus.  eICU Interventions  Prolonged EEG, Keppra 1 gm Q 12 hrs, Versed infusion @ 1 mg/ Hr, Neurology consultation, CT brain when stable to transport        Solymar Grace U Aleeza [place] 08/21/2017, 10:38 PM

## 2017-08-01 NOTE — Progress Notes (Signed)
I-stat Blood Gas results showed Panic pH and HCO3 values, results reported to MD at bedside and necessary treatment was ordered.

## 2017-08-01 NOTE — Progress Notes (Signed)
   May 10, 2017 0800  Clinical Encounter Type  Visited With Patient;Family  Visit Type Initial  Referral From Nurse  Consult/Referral To Chaplain  Spiritual Encounters  Spiritual Needs Prayer;Emotional  Stress Factors  Patient Stress Factors Exhausted  Family Stress Factors Exhausted    This was a page for a 47 year old coucasian male who was in the ED trauma C. Pt was a post CPR that proceeded to 2M14. Girlfriend was on-site and very tearful. Pt's girlfriend was very receptive and shared a lot about provided emotional support, compassionate presence and prayer.  Falana Clagg a Water quality scientistMusiko-Holley, E. I. du PontChaplain

## 2017-08-01 NOTE — ED Triage Notes (Signed)
Pt comes via [facility], found unresponsive at hotel after possible heroin use, initial rhythm asystole, PTA given 2mg  Narcan and 3 epi and on epi drip. Pupils fixed dilated. ROSC achieved NSR

## 2017-08-01 NOTE — Procedures (Signed)
Arterial Catheter Insertion Procedure Note George Costa [id number] [birth year]  Procedure: Insertion of Arterial Catheter  Indications: Blood pressure monitoring  Procedure Details Consent: Unable to obtain consent because of emergent medical necessity. Time Out: Verified patient identification, verified procedure, site/side was marked, verified correct patient position, special equipment/implants available, medications/allergies/relevent history reviewed, required imaging and test results available.  Performed  Maximum sterile technique was used including antiseptics, cap, gloves, gown, hand hygiene, mask and sheet. Skin prep: Chlorhexidine; local anesthetic administered 20 gauge catheter was inserted into left radial artery using the Seldinger technique. ULTRASOUND GUIDANCE USED: YES Evaluation Blood flow good; BP tracing good. Complications: No apparent complications.   Melanee Spryelson, Edi Gorniak Lawson 08/01/2017

## 2017-08-01 NOTE — Progress Notes (Signed)
Pharmacy Antibiotic Note  George Costa is a 47 y.o. male admitted on 08/23/2017 s/p CPR.  Pharmacy has been consulted for vancomycin dosing for possible pneumonia. Pt is hypothermic and WBC is WNL. SCr is elevated at 4.86 with unknown baseline. Lactic acid elevated.   Plan: Vancomycin 1500mg  IV x 1 - f/u Scr trend for further doses Zosyn per MD - dose is borderline F/u renal fxn, C&S, clinical status and [facility]/u care plan  Height: 6' (182.9 cm) Weight: 190 lb (86.2 kg)(from Jan 2019 records) IBW/kg (Calculated) : 77.6  Temp (24hrs), Avg:89 F (31.7 C), Min:86.7 F (30.4 C), Max:91.3 F (32.9 C)  Recent Labs  Lab 05-19-17 0545 05-19-17 0553  WBC 7.3  --   CREATININE 4.86*  --   LATICACIDVEN  --  12.29*    Estimated Creatinine Clearance: 20.2 mL/min (A) (by C-G formula based on SCr of 4.86 mg/dL (H)).    No Known Allergies  Antimicrobials this admission: Vanc 6/5>> Zosyn 6/5>>  Dose adjustments this admission: N/A  Microbiology results: Pending  Thank you for allowing pharmacy to be a part of this patient's care.  Sangeeta Youse, Drake LeachRachel Lynn 08/13/2017 7:54 AM

## 2017-08-01 NOTE — Code Documentation (Signed)
CCM at bedside 

## 2017-08-01 NOTE — Progress Notes (Signed)
ANTICOAGULATION CONSULT NOTE - Initial Consult  Pharmacy Consult for heparin Indication: chest pain/ACS  No Known Allergies  Patient Measurements: Height: 6' (182.9 cm) Weight: 190 lb (86.2 kg)(from Jan 2019 records) IBW/kg (Calculated) : 77.6 Heparin Dosing Weight: 86.2kg  Vital Signs: Temp: 91.3 F (32.9 C) (06/05 0551) Temp Source: Rectal (06/05 0551) BP: 58/24 (06/05 0640) Pulse Rate: 61 (06/05 0640)  Labs: Recent Labs    Jan 30, 2018 0545  HGB 17.2*  HCT 55.5*  PLT 231    CrCl cannot be calculated (No order found.).   Medical History: History reviewed. No pertinent past medical history.  Assessment: 47 yom s/p CPR to start IV heparin for NSTEMI when head CT is negative for bleed. Unable to get CT at this time as pt is too unstable for the CT scanner per RN. Baseline Hgb is elevated and platelets are WNL. Troponin is elevated.   Goal of Therapy:  Heparin level 0.3-0.7 units/ml Monitor platelets by anticoagulation protocol: Yes   Plan:  F/u head CT - if negative, will start IV heparin for NSTEMI  Jaspreet Bodner, Drake Leachachel Lynn 08/23/2017,7:18 AM

## 2017-08-01 NOTE — H&P (Signed)
PULMONARY / CRITICAL CARE MEDICINE   Name: George Costa MRN: [id number] DOB: March 29, 1970    ADMISSION DATE:  08/10/2017 CONSULTATION DATE:  08/07/2017  REFERRING MD:  Judd Lien - EDP  CHIEF COMPLAINT:  Cardiac Arrest  HISTORY OF PRESENT ILLNESS:  Pt is encephelopathic; therefore, this HPI is obtained from chart review. George Costa is a 47 y.o. male with no known PMH.  He was at a hotel night of 6/4.  Girlfriend woke up around 430AM and found him unresponsive, unknown for how long.  He had possible heroin use the night prior.  EMS was called and found pt in asystole.  He received 2mg  narcan and 4 rounds epi before starting an epi infusion.  ROSC was then achieved after reportedly ~ 25 minutes.  He was intubated in field and brought to [facility] ED where he remained unresponsive.  He also had hypotension so was started on neosynephrine.  Head CT and all labs currently still pending.  PAST MEDICAL HISTORY :  He  has no past medical history on file.  PAST SURGICAL HISTORY: He  has a past surgical history that includes Appendectomy and Hernia repair.  No Known Allergies  No current facility-administered medications on file prior to encounter.    Current Outpatient Medications on File Prior to Encounter  Medication Sig  . chlorproMAZINE (THORAZINE) 10 MG tablet Take 1 tablet (10 mg total) by mouth 3 (three) times daily as needed for hiccoughs.  . chlorproMAZINE (THORAZINE) 25 MG tablet Take 1 tablet (25 mg total) by mouth 3 (three) times daily as needed for hiccoughs.  . testosterone cypionate (DEPOTESTOTERONE CYPIONATE) 100 MG/ML injection Inject 100 mg into the muscle every 7 (seven) days. For IM use only    FAMILY HISTORY:  His has no family status information on file.    SOCIAL HISTORY: He  reports that he has been smoking.  He has never used smokeless tobacco. He reports that he drinks alcohol. He reports that he has current or past drug history. Drug: Marijuana.  REVIEW OF SYSTEMS:    Unable to obtain as pt is encephalopathic.  SUBJECTIVE:  On vent, unresponsive.  VITAL SIGNS: BP (!) 72/48   Pulse 62   Temp (!) 91.3 F (32.9 C) (Rectal)   Resp 14   SpO2 100%   HEMODYNAMICS:    VENTILATOR SETTINGS: Vent Mode: PRVC FiO2 (%):  [100 %] 100 % Set Rate:  [24 bmp] 24 bmp Vt Set:  [409 mL] 620 mL PEEP:  [5 cmH20] 5 cmH20 Plateau Pressure:  [14 cmH20] 14 cmH20  INTAKE / OUTPUT: No intake/output data recorded.   PHYSICAL EXAMINATION: General: Young male, critically ill. Neuro: Non-responsive.  Breathes over vent. HEENT: Barre/AT. Sclerae anicteric, pupils sluggish. Cardiovascular: RRR, no M/R/G.  Lungs: Respirations even and unlabored.  CTA bilaterally, No W/R/R. Abdomen: BS x 4, soft, NT/ND.  Musculoskeletal: No gross deformities, no edema.  Skin: Abrasions to forehead and to left knee.  Skin warm, no rashes.    LABS:  BMET No results for input(s): NA, K, CL, CO2, BUN, CREATININE, GLUCOSE in the last 168 hours.  Electrolytes No results for input(s): CALCIUM, MG, PHOS in the last 168 hours.  CBC No results for input(s): WBC, HGB, HCT, PLT in the last 168 hours.  Coag's No results for input(s): APTT, INR in the last 168 hours.  Sepsis Markers Recent Labs  Lab 08/05/2017 0553  LATICACIDVEN 12.29*    ABG Recent Labs  Lab 08/07/2017 0621  PHART 7.060*  PCO2ART 35.1  PO2ART 181.0*    Liver Enzymes No results for input(s): AST, ALT, ALKPHOS, BILITOT, ALBUMIN in the last 168 hours.  Cardiac Enzymes No results for input(s): TROPONINI, PROBNP in the last 168 hours.  Glucose No results for input(s): GLUCAP in the last 168 hours.  Imaging No results found.   STUDIES:  CT head 6/5 >  CXR 6/5 >   CULTURES: Blood 6/5 >  Sputum 6/5 >  Urine 6/5 >   ANTIBIOTICS: None.  SIGNIFICANT EVENTS: 6/5 > admit.  LINES/TUBES: ETT 6/5 >  CVL pending 6/5 > Art line pending 6/5 >   DISCUSSION: 47 y.o. male with no PMH, [facility]  after being found down at hotel.  Possible heroin use.  Unknown downtime but ~ 25 minutes with EMS working on him prior to ROSC.  Remained unresponsive in ED, started on pressors for persistent hypotension.  ASSESSMENT / PLAN:  PULMONARY A: Respiratory insufficiency - s/p intubation in field. P:   Full vent support. Wean as able. VAP prevention measures. SBT once mental status improves. CXR in AM.  CARDIOVASCULAR A:  Cardiac Arrest - presumed due to heroin overdose. Shock - presumed cardiogenic + hypovolemic. Troponin bump - due to above +/- NSTEMI. P:  Trend troponin, lactate. Assess echo. Place CVL. Norepinephrine as needed for goal MAP > 65. Not a TTM candidate given prolonged / unknown downtime. Will need cardiology consult. Heparin gtt once CT head done and confirms no bleed.  RENAL A:   Metabolic acidosis - based on ABG.  Presumed due to lactic acidosis; BMP pending. P:   HCO3 @ 125. Follow up on BMP.  GASTROINTESTINAL A:   GI prophylaxis. Nutrition. P:   SUP: Famotidine. NPO.  HEMATOLOGIC A:   VTE Prophylaxis. P:  SCD's / heparin gtt once CT head complete and confirms no bleed. CBC in AM.  INFECTIOUS A:   No indication of infection. P:   Monitor clinically.  ENDOCRINE A:   No acute issues. P:   SSI if glucose consistently > 180.  NEUROLOGIC A:   Acute encephalopathy - concern for anoxia given unknown / prolonged downtime. Hx polysubstance abuse per report - per report, possible heroin use. P:   Sedation:  Fentanyl PRN / Midazolam PRN. RASS goal: 0 to -1. Daily WUA. F/u CT head, UDS. Substance abuse counseling.  Family updated: None available.  Interdisciplinary Family Meeting v Palliative Care Meeting:  Due by: 08/07/17.  CC time: 35 minutes.   [name], [place]PA - [facility] Pager: [phone]  or [phone] 08/16/2017, 6:26 AM

## 2017-08-01 NOTE — Progress Notes (Signed)
CRITICAL VALUE ALERT  Critical Value:  Troponin 20.44   Date & Time Notied:  6:54 08/01/2017  Provider Notified: Dr. Delton CoombesByrum  Orders Received/Actions taken: continue to monitor patient, awaiting orders

## 2017-08-01 NOTE — Progress Notes (Signed)
ETT advanced 3cm per MD order, now secured 28@ lip

## 2017-08-01 NOTE — Progress Notes (Signed)
Patient transported to 2M14.

## 2017-08-01 NOTE — Procedures (Signed)
Central Venous Catheter Insertion Procedure Note George Costa [id number] November 17, 1970  Procedure: Insertion of Central Venous Catheter Indications: Assessment of intravascular volume, Drug and/or fluid administration and Frequent blood sampling  Procedure Details Consent: Unable to obtain consent because of emergent medical necessity. Time Out: Verified patient identification, verified procedure, site/side was marked, verified correct patient position, special equipment/implants available, medications/allergies/relevent history reviewed, required imaging and test results available.  Performed  Maximum sterile technique was used including antiseptics, cap, gloves, gown, hand hygiene, mask and sheet. Skin prep: Chlorhexidine; local anesthetic administered A antimicrobial bonded/coated triple lumen catheter was placed in the right internal jugular vein using the Seldinger technique.  Evaluation Blood flow good Complications: No apparent complications Patient did tolerate procedure well. Chest X-ray ordered to verify placement.  CXR: normal.  George Costa 07/31/2017, 7:15 AM

## 2017-08-01 NOTE — Progress Notes (Signed)
RT has paged CCM to give ABG critical results.

## 2017-08-01 NOTE — Progress Notes (Signed)
Per Lew DawesKelly Moon RN pt is too unstable to get Head CT on the way to room

## 2017-08-01 NOTE — Procedures (Signed)
Arterial Catheter Insertion Procedure Note Elberta SpanielJames W Farooq [id number] 11/08/1970  Procedure: Insertion of Arterial Catheter  Indications: Blood pressure monitoring and Frequent blood sampling  Procedure Details Consent: Unable to obtain consent because of emergent medical necessity. Time Out: Verified patient identification, verified procedure, site/side was marked, verified correct patient position, special equipment/implants available, medications/allergies/relevent history reviewed, required imaging and test results available.  Performed  Maximum sterile technique was used including antiseptics, cap, gloves, gown, hand hygiene, mask and sheet. Skin prep: Chlorhexidine; local anesthetic administered 20 gauge catheter was inserted into right femoral artery using the Seldinger technique.  Evaluation Blood flow good; BP tracing good. Complications: No apparent complications.   Brett CanalesSteve Ira Busbin ACNP [facility]Le [facility] P[facility]M Pager [phone] till 3 pm If no answer page [phone] 07/29/2017, 10:36 AM

## 2017-08-01 NOTE — ED Provider Notes (Addendum)
[facility] EMERGENCY DEPARTMENT Provider Note   CSN: [id number] Arrival date & time: 07/30/2017  0539     History   Chief Complaint Chief Complaint  Patient presents with  . Post CPR    HPI George Costa is a 47 y.o. male.  Patient is a 47 year old male brought by EMS after being found unresponsive.  It was reported to me he was staying in a hotel room with a male companion who found him on the floor not breathing and unresponsive.  911 was called and paramedics initially found the patient  The history is provided by the EMS personnel.    No past medical history on file.  There are no active problems to display for this patient.   Past Surgical History:  Procedure Laterality Date  . APPENDECTOMY    . HERNIA REPAIR          Home Medications    Prior to Admission medications   Medication Sig Start Date End Date Taking? Authorizing Provider  chlorproMAZINE (THORAZINE) 10 MG tablet Take 1 tablet (10 mg total) by mouth 3 (three) times daily as needed for hiccoughs. 03/18/15   Palumbo, April, MD  chlorproMAZINE (THORAZINE) 25 MG tablet Take 1 tablet (25 mg total) by mouth 3 (three) times daily as needed for hiccoughs. 12/07/13   Horton, Mayer Masker, MD  testosterone cypionate (DEPOTESTOTERONE CYPIONATE) 100 MG/ML injection Inject 100 mg into the muscle every 7 (seven) days. For IM use only    [provider]    Family History No family history on file.  Social History Social History   Tobacco Use  . Smoking status: Current Every Day Smoker  . Smokeless tobacco: Never Used  Substance Use Topics  . Alcohol use: Yes  . Drug use: Yes    Types: Marijuana     Allergies   Patient has no known allergies.   Review of Systems Review of Systems   Physical Exam Updated Vital Signs BP (!) 76/51   Pulse (!) 59   Temp (!) 91.3 F (32.9 C) (Rectal)   Resp (!) 6   SpO2 98%   Physical Exam  Constitutional: He appears well-developed  and well-nourished.  HENT:  Head: Normocephalic and atraumatic.  Eyes:  Pupils 5 mm and nonreactive  Cardiovascular: Normal rate.  No murmur heard. Pulmonary/Chest:  Patient is unresponsive on ventilator/intubated.  Breath sounds are coarse throughout.  Abdominal: Soft. He exhibits no distension. There is no tenderness.  Musculoskeletal: Normal range of motion.  Neurological:  GCS 3  Skin: There is pallor.     ED Treatments / Results  Labs (all labs ordered are listed, but only abnormal results are displayed) Labs Reviewed  I-STAT CG4 LACTIC ACID, ED - Abnormal; Notable for the following components:      Result Value   Lactic Acid, Venous 12.29 (*)    All other components within normal limits  CBC  BASIC METABOLIC PANEL  APTT  PROTIME-INR  RAPID URINE DRUG SCREEN, HOSP PERFORMED  URINALYSIS, ROUTINE W REFLEX MICROSCOPIC  I-STAT TROPONIN, ED    EKG EKG Interpretation  Date/Time:  Wednesday August 01 2017 05:46:09 EDT Ventricular Rate:  59 PR Interval:    QRS Duration: 89 QT Interval:  485 QTC Calculation: 481 R Axis:   104 Text Interpretation:  Sinus rhythm Right axis deviation Borderline repolarization abnormality Borderline prolonged QT interval Confirmed by Geoffery Lyons ([id number]) on 08/22/2017 5:55:21 AM   Radiology No results found.  Procedures Procedures (including  critical care time)  Medications Ordered in ED Medications  0.9 %  sodium chloride infusion (1,000 mLs Intravenous New Bag/Given 07/31/2017 0555)     Initial Impression / Assessment and Plan / ED Course  I have reviewed the triage vital signs and the nursing notes.  Pertinent labs & imaging results that were available during my care of the patient were reviewed by me and considered in my medical decision making (see chart for details).  Patient brought from a local hotel after being found unresponsive by his male companion.  I am told he has a history of IV drug abuse, but was apparently not  using this evening.  EMS arrived to find the patient in asystole.  He was given several rounds of cardiac meds, CPR, intubated, then transported here.  By the time he arrived here he was in a sinus rhythm, but hypotensive.  Laboratory studies were obtained and fluid resuscitation was initiated.  He remained hypotensive and was given doses of phenylephrine with little improvement.  So far, lactate has returned at 12 and troponin over 3.  I have consulted critical care who has assumed care and admit to the ICU.  I am uncertain as to what caused this cardiac arrest, however drug overdose is definitely considered the patient did not improve with Narcan given in the field.  CRITICAL CARE Performed by: Geoffery Lyonsouglas Kieara Schwark Total critical care time: 35 minutes Critical care time was exclusive of separately billable procedures and treating other patients. Critical care was necessary to treat or prevent imminent or life-threatening deterioration. Critical care was time spent personally by me on the following activities: development of treatment plan with patient and/or surrogate as well as nursing, discussions with consultants, evaluation of patient's response to treatment, examination of patient, obtaining history from patient or surrogate, ordering and performing treatments and interventions, ordering and review of laboratory studies, ordering and review of radiographic studies, pulse oximetry and re-evaluation of patient's condition.   Final Clinical Impressions(s) / ED Diagnoses   Final diagnoses:  None    ED Discharge Orders    None       Geoffery Lyonselo, Gadiel John, MD 07/31/2017 [id number]    Geoffery Lyonselo, Vinton Layson, MD 08/20/2017 [id number]    Geoffery Lyonselo, Zaxton Angerer, MD 08/14/17 [phone]

## 2017-08-01 NOTE — Progress Notes (Addendum)
PULMONARY / CRITICAL CARE MEDICINE   Name: George Costa MRN: [id number] DOB: 09-10-1970    ADMISSION DATE:  Aug 14, 2017 CONSULTATION DATE:  2017/08/14  REFERRING MD:  Judd Lien - EDP  CHIEF COMPLAINT:  Cardiac Arrest  HISTORY OF PRESENT ILLNESS:  Pt is encephelopathic; therefore, this HPI is obtained from chart review. George Costa is a 47 y.o. male with no known PMH.  He was at a hotel night of 6/4.  Girlfriend woke up around 430AM and found him unresponsive, unknown for how long.  He had possible heroin use the night prior.  EMS was called and found pt in asystole.  He received 2mg  narcan and 4 rounds epi before starting an epi infusion.  ROSC was then achieved after reportedly ~ 25 minutes.  He was intubated in field and brought to [facility] ED where he remained unresponsive.  He also had hypotension so was started on neosynephrine.  Head CT currently still pending.    SUBJECTIVE: Asynchronous with ventilator, profoundly acidotic.  VITAL SIGNS: BP (!) 58/24   Pulse 61   Temp (!) 91.3 F (32.9 C) (Rectal)   Resp 13   Ht 6' (1.829 m)   Wt 86.2 kg (190 lb) Comment: from Jan 2019 records  SpO2 95%   BMI 25.77 kg/m   HEMODYNAMICS:    VENTILATOR SETTINGS: Vent Mode: PRVC FiO2 (%):  [100 %] 100 % Set Rate:  [24 bmp-26 bmp] 26 bmp Vt Set:  [620 mL] 620 mL PEEP:  [5 cmH20-14 cmH20] 14 cmH20 Plateau Pressure:  [14 cmH20] 14 cmH20  INTAKE / OUTPUT: I/O last 3 completed shifts: In: 2000 [I.V.:2000] Out: -    PHYSICAL EXAMINATION: General: Age male on full ventilatory support overbreathing vent, asynchronous with the vent HEENT: Tracheal tube in place, pupils pinpoint, negative doll's eyes, overbreathing the vent. Neuro: Nonresponsive to noxious stimuli, CV: ,Heart sounds are distant, 3 pressor support PULM: Coarse rhonchi bilaterally GI: No bowel sounds, distended Extremities: warm/dry, 1+ edema  Skin: no rashes or lesions     LABS:  BMET Recent Labs  Lab August 14, 2017 0545   NA 144  K 4.5  CL 104  CO2 18*  BUN 37*  CREATININE 4.86*  GLUCOSE 85    Electrolytes Recent Labs  Lab 08-14-2017 0545  CALCIUM 7.6*    CBC Recent Labs  Lab 08-14-2017 0545  WBC 7.3  HGB 17.2*  HCT 55.5*  PLT 231    Coag's No results for input(s): APTT, INR in the last 168 hours.  Sepsis Markers Recent Labs  Lab 08/14/2017 0553  LATICACIDVEN 12.29*    ABG Recent Labs  Lab 14-Aug-2017 0621 2017-08-14 0739 08-14-2017 0936  PHART 7.060* 7.213* 7.202*  PCO2ART 35.1 47.6 54.8*  PO2ART 181.0* 40.0* 36.0*    Liver Enzymes No results for input(s): AST, ALT, ALKPHOS, BILITOT, ALBUMIN in the last 168 hours.  Cardiac Enzymes No results for input(s): TROPONINI, PROBNP in the last 168 hours.  Glucose Recent Labs  Lab 08/14/2017 0812  GLUCAP 94    Imaging Dg Chest Portable 1 View  Result Date: August 14, 2017 CLINICAL DATA:  Central catheter placement EXAM: PORTABLE CHEST 1 VIEW COMPARISON:  August 14, 2017 study obtained earlier in the day FINDINGS: Central catheter tip is in the superior vena cava. Endotracheal tube tip is 7.4 cm above carina. Nasogastric tube tip and side port are in the stomach. No pneumothorax. There is patchy airspace consolidation in both mid lung regions. Lungs elsewhere clear. Heart size and pulmonary vascularity are  normal. No adenopathy. No bone lesions. IMPRESSION: Tube and catheter positions as described without pneumothorax. Airspace consolidation felt to represent pneumonia in both mid lung regions, somewhat more on the left than on the right. Lungs elsewhere clear. Heart size normal. Electronically Signed   By: Bretta BangWilliam  Woodruff III M.D.   On: 11-01-2017 07:21   Dg Chest Portable 1 View  Result Date: 08/12/2017 CLINICAL DATA:  Endotracheal tube placement. EXAM: PORTABLE CHEST 1 VIEW COMPARISON:  Chest radiograph performed 06/08/2015 FINDINGS: The patient's endotracheal tube is seen ending 9 cm above the carina. An enteric tube is noted extending below  the diaphragm. Right infrahilar and left midlung airspace opacification is concerning for multifocal pneumonia. The lung bases are incompletely imaged on this study. No significant pleural effusion or pneumothorax is seen. The cardiomediastinal silhouette is normal in size. No acute osseous abnormalities are identified. IMPRESSION: 1. Endotracheal tube seen ending 9 cm above the carina. This could be advanced 5-6 cm. 2. Right infrahilar and left midlung airspace opacification is concerning for multifocal pneumonia. Electronically Signed   By: Roanna RaiderJeffery  Chang M.D.   On: 11-01-2017 06:28     STUDIES:  CT head 6/5 > unable to do secondary to being in stable CXR 6/5 > endotracheal tube central line in good position  CULTURES: Blood 6/5 >  Sputum 6/5 >  Urine 6/5 >   ANTIBIOTICS: 08/23/2017 vancomycin and Zosyn  SIGNIFICANT EVENTS: 6/5 > admit.  LINES/TUBES: ETT 6/5 >  CVL right IJ 6/5 > Art line left radial 6/5 >  Right femoral arterial line 08/08/2017>>  DISCUSSION: 47 y.o. male with no PMH, [facility] ED 6/5 after being found down at hotel.  Possible heroin use.  Unknown downtime but ~ 25 minutes with EMS working on him prior to ROSC.  Remained unresponsive in ED, started on pressors for persistent hypotension.  07/28/2017 he is proven refractory to treatments due to his severe metabolic acidosis hypoxic respiratory failure and anoxic injury he may not survive.  ASSESSMENT / PLAN:  PULMONARY A: Respiratory insufficiency - s/p intubation in field. Refractory hypoxemia P:   Full vent support PEEP increased to 14 Transfer to pressure control ventilation 16/14, repeat arterial blood gas within 20 minutes FiO2 is 100% He remains refractory to current treatment May not survive this insult  CARDIOVASCULAR A:  Cardiac Arrest - presumed due to heroin overdose. Shock - presumed cardiogenic + hypovolemic. Troponin bump - due to above +/- NSTEMI. P:  Refractory hypertension Continue  Levophed, Neo-Synephrine, dopamine Attempt to correct metabolic acidosis PA increase respiratory rate, continuation of bicarb drip.  May be something else driving his acidosis i.e. infarcted tissue possibly in abdomen  RENAL Lab Results  Component Value Date   CREATININE 4.86 (H) 11-01-2017   Recent Labs  Lab August 22, 2017 0545  K 4.5    A:   Acute renal failure Metabolic acidosis - based on ABG.  Presumed due to lactic acidosis; BMP pending. P:   Bicarb drip at 125 Repeat 2  amps bicarb Continue fluids Repeat labs   GASTROINTESTINAL A:   GI prophylaxis. Nutrition. P:   Pepcid N.p.o.  HEMATOLOGIC Recent Labs    August 22, 2017 0545  HGB 17.2*    A:   VTE Prophylaxis. P:  PAS Heparin drip on CT head performed to rule out intracerebral hemorrhage INFECTIOUS A:   No indication of infection. P:   Monitor  ENDOCRINE CBG (last 3)  Recent Labs    August 22, 2017 0812  GLUCAP 94  A:   No acute issues. P:   Sliding scale insulin  NEUROLOGIC A:   Acute encephalopathy - concern for anoxia given unknown / prolonged downtime. Hx polysubstance abuse per report - per report, possible heroin use. P:   Currently unresponsive May need neuromuscular blockade for ventilation Needs CT of the head once stable if he survives Neurology consult once CT  of the head is been obtained  Family updated: 2017/08/22 no family at bedside  Interdisciplinary Family Meeting v Palliative Care Meeting:  Due by: 08/07/17.  CC time: 60 minutes.   George Costa ACNP Adolph Pollack PCCM Pager [phone] till 1 pm If no answer page [phone] 08-22-17, 9:43 AM  Attending Note:  47 year old male with heroin abuse history who was found down for unknown period of time in cardiac arrest.  Patient was brought the ED and intubated, central access was achieved.  Patient reportedly aspirated and is in ARDS.  On exam, diffuse rales in all lung fields.  I reviewed CXR myself, ETT is too high and infiltrate  and pulmonary edema noted.  Patient has never been stable enough for head CT which is ultimately what is needed right now.  No family bedside to discuss code status.  I reviewed ABG and adjusted vent on multiple occassions.  Remains in severe ARDS and in refractory shock.  Extensive conversation with the family, after a long discussion, decision was made to make patient DNR with no further escalation of care.  The patient is critically ill with multiple organ systems failure and requires high complexity decision making for assessment and support, frequent evaluation and titration of therapies, application of advanced monitoring technologies and extensive interpretation of multiple databases.   Critical Care Time devoted to patient care services described in this note is  60  Minutes. This time reflects time of care of this signee Dr Koren Bound. This critical care time does not reflect procedure time, or teaching time or supervisory time of PA/NP/Med student/Med Resident etc but could involve care discussion time.  Alyson Reedy, M.D. [facility] Pulmonary/Critical Care Medicine. Pager: [phone]. After hours pager: [phone].

## 2017-08-02 ENCOUNTER — Inpatient Hospital Stay (HOSPITAL_COMMUNITY): Payer: No Typology Code available for payment source

## 2017-08-02 DIAGNOSIS — J80 Acute respiratory distress syndrome: Secondary | ICD-10-CM

## 2017-08-02 LAB — BLOOD CULTURE ID PANEL (REFLEXED)
ACINETOBACTER BAUMANNII: NOT DETECTED
CANDIDA GLABRATA: NOT DETECTED
CANDIDA KRUSEI: NOT DETECTED
Candida albicans: NOT DETECTED
Candida parapsilosis: NOT DETECTED
Candida tropicalis: NOT DETECTED
ENTEROBACTER CLOACAE COMPLEX: NOT DETECTED
ENTEROBACTERIACEAE SPECIES: NOT DETECTED
ESCHERICHIA COLI: NOT DETECTED
Enterococcus species: NOT DETECTED
Haemophilus influenzae: NOT DETECTED
Klebsiella oxytoca: NOT DETECTED
Klebsiella pneumoniae: NOT DETECTED
Listeria monocytogenes: NOT DETECTED
NEISSERIA MENINGITIDIS: NOT DETECTED
PSEUDOMONAS AERUGINOSA: NOT DETECTED
Proteus species: NOT DETECTED
STREPTOCOCCUS AGALACTIAE: NOT DETECTED
STREPTOCOCCUS PNEUMONIAE: DETECTED — AB
Serratia marcescens: NOT DETECTED
Staphylococcus aureus (BCID): NOT DETECTED
Staphylococcus species: NOT DETECTED
Streptococcus pyogenes: NOT DETECTED
Streptococcus species: DETECTED — AB

## 2017-08-02 LAB — MRSA PCR SCREENING: MRSA BY PCR: NEGATIVE

## 2017-08-02 LAB — GLUCOSE, CAPILLARY
GLUCOSE-CAPILLARY: 40 mg/dL — AB (ref 65–99)
GLUCOSE-CAPILLARY: 62 mg/dL — AB (ref 65–99)
GLUCOSE-CAPILLARY: 63 mg/dL — AB (ref 65–99)
GLUCOSE-CAPILLARY: 69 mg/dL (ref 65–99)
Glucose-Capillary: 116 mg/dL — ABNORMAL HIGH (ref 65–99)
Glucose-Capillary: 118 mg/dL — ABNORMAL HIGH (ref 65–99)
Glucose-Capillary: 113 mg/dL — ABNORMAL HIGH (ref 65–99)
Glucose-Capillary: <10 mg/dL — CL (ref 65–99)

## 2017-08-02 LAB — CBC
HCT: 48.4 % (ref 39.0–52.0)
Hemoglobin: 16 g/dL (ref 13.0–17.0)
MCH: 30 pg (ref 26.0–34.0)
MCHC: 33.1 g/dL (ref 30.0–36.0)
MCV: 90.8 fL (ref 78.0–100.0)
PLATELETS: 167 10*3/uL (ref 150–400)
RBC: 5.33 MIL/uL (ref 4.22–5.81)
RDW: 13 % (ref 11.5–15.5)
WBC: 3.5 10*3/uL — ABNORMAL LOW (ref 4.0–10.5)

## 2017-08-02 LAB — COMPREHENSIVE METABOLIC PANEL
ALK PHOS: 47 U/L (ref 38–126)
ALT: 307 U/L — ABNORMAL HIGH (ref 17–63)
ANION GAP: 20 — AB (ref 5–15)
AST: 674 U/L — ABNORMAL HIGH (ref 15–41)
Albumin: 2.1 g/dL — ABNORMAL LOW (ref 3.5–5.0)
BILIRUBIN TOTAL: 0.7 mg/dL (ref 0.3–1.2)
BUN: 50 mg/dL — ABNORMAL HIGH (ref 6–20)
CALCIUM: 5.1 mg/dL — AB (ref 8.9–10.3)
CO2: 23 mmol/L (ref 22–32)
Chloride: 101 mmol/L (ref 101–111)
Creatinine, Ser: 5.26 mg/dL — ABNORMAL HIGH (ref 0.61–1.24)
GFR calc non Af Amer: 12 mL/min — ABNORMAL LOW (ref >60)
GFR, EST AFRICAN AMERICAN: 13 mL/min — AB (ref >60)
GLUCOSE: 87 mg/dL (ref 65–99)
Potassium: 4.1 mmol/L (ref 3.5–5.1)
Sodium: 144 mmol/L (ref 135–145)
TOTAL PROTEIN: 4.3 g/dL — AB (ref 6.5–8.1)

## 2017-08-02 LAB — POCT I-STAT 3, ART BLOOD GAS (G3+)
ACID-BASE DEFICIT: 5 mmol/L — AB (ref 0.0–2.0)
Bicarbonate: 23.8 mmol/L (ref 20.0–28.0)
O2 SAT: 91 %
PCO2 ART: 54.9 mmHg — AB (ref 32.0–48.0)
PH ART: 7.243 — AB (ref 7.350–7.450)
Patient temperature: 98
TCO2: 25 mmol/L (ref 22–32)
pO2, Arterial: 70 mmHg — ABNORMAL LOW (ref 83.0–108.0)

## 2017-08-02 LAB — HIV ANTIBODY (ROUTINE TESTING W REFLEX): HIV Screen 4th Generation wRfx: NONREACTIVE

## 2017-08-02 LAB — PHOSPHORUS: PHOSPHORUS: 6.7 mg/dL — AB (ref 2.5–4.6)

## 2017-08-02 LAB — AMMONIA: AMMONIA: 49 umol/L — AB (ref 9–35)

## 2017-08-02 LAB — MAGNESIUM: Magnesium: 1.4 mg/dL — ABNORMAL LOW (ref 1.7–2.4)

## 2017-08-02 MED ORDER — DEXTROSE-NACL 5-0.45 % IV SOLN
INTRAVENOUS | Status: DC
  Administered 2017-08-02: 07:00:00 via INTRAVENOUS

## 2017-08-02 MED ORDER — ACETAMINOPHEN 650 MG RE SUPP
650.0000 mg | RECTAL | Status: DC | PRN
  Administered 2017-08-02: 650 mg via RECTAL
  Filled 2017-08-02: qty 1

## 2017-08-02 MED ORDER — MIDAZOLAM BOLUS VIA INFUSION
0.2000 mg/kg | Freq: Once | INTRAVENOUS | Status: AC
Start: 2017-08-02 — End: 2017-08-02
  Administered 2017-08-02: 19.6 mg via INTRAVENOUS
  Filled 2017-08-02: qty 20

## 2017-08-02 MED ORDER — DEXTROSE 50 % IV SOLN
INTRAVENOUS | Status: AC
  Administered 2017-08-02: 50 mL via INTRAVENOUS
  Filled 2017-08-02: qty 50

## 2017-08-02 MED ORDER — SODIUM CHLORIDE 0.9 % IV SOLN
0.2000 mg/kg/h | INTRAVENOUS | Status: DC
  Administered 2017-08-02: 0.2 mg/kg/h via INTRAVENOUS
  Filled 2017-08-02: qty 10

## 2017-08-02 MED ORDER — DEXTROSE 50 % IV SOLN
1.0000 | Freq: Once | INTRAVENOUS | Status: AC
  Administered 2017-08-02: 50 mL via INTRAVENOUS

## 2017-08-02 MED ORDER — FAMOTIDINE IN NACL 20-0.9 MG/50ML-% IV SOLN: 20.0000 mg | INTRAVENOUS | Status: DC

## 2017-08-02 MED ORDER — PIPERACILLIN-TAZOBACTAM IN DEX 2-0.25 GM/50ML IV SOLN
2.2500 g | Freq: Four times a day (QID) | INTRAVENOUS | Status: DC
  Filled 2017-08-02: qty 50

## 2017-08-02 MED ORDER — DEXTROSE 50 % IV SOLN
INTRAVENOUS | Status: AC
  Administered 2017-08-02: 25 mL via INTRAVENOUS
  Filled 2017-08-02: qty 50

## 2017-08-02 MED ORDER — DEXTROSE 50 % IV SOLN
25.0000 mL | Freq: Once | INTRAVENOUS | Status: AC
  Administered 2017-08-02: 25 mL via INTRAVENOUS

## 2017-08-02 MED ORDER — SODIUM CHLORIDE 0.9 % IV SOLN
0.2000 mg/kg/h | INTRAVENOUS | Status: DC
  Administered 2017-08-02: 0.2 mg/kg/h via INTRAVENOUS
  Filled 2017-08-02: qty 50

## 2017-08-02 MED ORDER — DEXTROSE 50 % IV SOLN
INTRAVENOUS | Status: AC
  Filled 2017-08-02: qty 50

## 2017-08-03 LAB — URINE CULTURE: CULTURE: NO GROWTH

## 2017-08-03 LAB — CULTURE, BLOOD (ROUTINE X 2)

## 2017-08-03 LAB — CALCIUM, IONIZED: Calcium, Ionized, Serum: <3 mg/dL — ABNORMAL LOW (ref 4.5–5.6)

## 2017-08-06 ENCOUNTER — Telehealth: Payer: Self-pay

## 2017-08-06 NOTE — Telephone Encounter (Signed)
On 08/06/17 [facility] The d/c is for cremation.   The patient is a patient of Doctor Byrum.   The d/c will be taken to Pulmonary Unit for signature.  On 08/07/17 I received the d/c back from Doctor Byrum.  I got the d/c ready and faxed the d/c to the funeral home per the funeral home request.

## 2017-08-08 ENCOUNTER — Telehealth: Payer: Self-pay

## 2017-08-08 NOTE — Telephone Encounter (Signed)
On 08/08/17 I [facility] The d/c is for cremation.  The patient is a patient of Doctor Byrum.  The d/c will be taken to [facility] for signature.  On 08/13/17 I received the d/c back from Doctor Byrum.  I got the d/c ready and mailed the d/c to vital records per the funeral home request.

## 2017-08-23 DIAGNOSIS — N179 Acute kidney failure, unspecified: Secondary | ICD-10-CM | POA: Diagnosis present

## 2017-08-23 DIAGNOSIS — B953 Streptococcus pneumoniae as the cause of diseases classified elsewhere: Secondary | ICD-10-CM | POA: Diagnosis present

## 2017-08-23 DIAGNOSIS — E872 Acidosis: Secondary | ICD-10-CM | POA: Diagnosis present

## 2017-08-23 DIAGNOSIS — I214 Non-ST elevation (NSTEMI) myocardial infarction: Secondary | ICD-10-CM | POA: Diagnosis present

## 2017-08-23 DIAGNOSIS — E8729 Other acidosis: Secondary | ICD-10-CM | POA: Diagnosis present

## 2017-08-23 DIAGNOSIS — R7881 Bacteremia: Secondary | ICD-10-CM | POA: Diagnosis present

## 2017-08-23 DIAGNOSIS — F1910 Other psychoactive substance abuse, uncomplicated: Secondary | ICD-10-CM | POA: Diagnosis present

## 2017-08-23 DIAGNOSIS — R569 Unspecified convulsions: Secondary | ICD-10-CM

## 2017-08-27 NOTE — Progress Notes (Signed)
PHARMACY - PHYSICIAN COMMUNICATION CRITICAL VALUE ALERT - BLOOD CULTURE IDENTIFICATION (BCID)  George Costa is an 47 y.o. male who presented to [facility] on 08/10/2017 with cardiac arrest.  Assessment:  Gram stain in 3/4 bottles was GPC, rapid ID = strep pneumo  Name of physician (or Provider) Contacted: O.Ogan  Current antibiotics: Vanc and Zosyn  Changes to prescribed antibiotics recommended:  Recommendations declined by provider due to clinical deterioration  Results for orders placed or performed during the hospital encounter of 07/08/2017  Blood Culture ID Panel (Reflexed) (Collected: 07/28/2017  8:23 AM)  Result Value Ref Range   Enterococcus species NOT DETECTED NOT DETECTED   Listeria monocytogenes NOT DETECTED NOT DETECTED   Staphylococcus species NOT DETECTED NOT DETECTED   Staphylococcus aureus NOT DETECTED NOT DETECTED   Streptococcus species DETECTED (A) NOT DETECTED   Streptococcus agalactiae NOT DETECTED NOT DETECTED   Streptococcus pneumoniae DETECTED (A) NOT DETECTED   Streptococcus pyogenes NOT DETECTED NOT DETECTED   Acinetobacter baumannii NOT DETECTED NOT DETECTED   Enterobacteriaceae species NOT DETECTED NOT DETECTED   Enterobacter cloacae complex NOT DETECTED NOT DETECTED   Escherichia coli NOT DETECTED NOT DETECTED   Klebsiella oxytoca NOT DETECTED NOT DETECTED   Klebsiella pneumoniae NOT DETECTED NOT DETECTED   Proteus species NOT DETECTED NOT DETECTED   Serratia marcescens NOT DETECTED NOT DETECTED   Haemophilus influenzae NOT DETECTED NOT DETECTED   Neisseria meningitidis NOT DETECTED NOT DETECTED   Pseudomonas aeruginosa NOT DETECTED NOT DETECTED   Candida albicans NOT DETECTED NOT DETECTED   Candida glabrata NOT DETECTED NOT DETECTED   Candida krusei NOT DETECTED NOT DETECTED   Candida parapsilosis NOT DETECTED NOT DETECTED   Candida tropicalis NOT DETECTED NOT DETECTED    George Costa, PharmD, BCPS  08/08/2017  2:21 AM

## 2017-08-27 NOTE — Progress Notes (Signed)
eLink Physician-Brief Progress Note Patient Name: Elberta SpanielJames W Duddy DOB: 05-11-1970 MRN: [id number]   Date of Service  08/25/2017  HPI/Events of Note  Persistent hypoglycemia  eICU Interventions  D 5 1/2 NS @ 75 ml/ Hr        Sindy Mccune U Jeanne Terrance 08/23/2017, 6:48 AM

## 2017-08-27 NOTE — Consult Note (Addendum)
Neurology Consultation Reason for Consult: Myoclonic status/Anoxic injury Referring Physician: Dr Warrick Parisiangan    History is obtained from:Chart review  HPI: Elberta SpanielJames W Quiggle is a 47 y.o. male with past medical history of heroin abuse presents [facility] after patient had cardiac arrest. According to the girlfriend, the patient used heroin the night before and was lethargic for most of the day.  Around 4:30 in the morning patient was asking for water and then suddenly was on the side of the wall having deep respirations.  He then became unconscious she called 911 started chest compressions.  On arrival EMS found patient in asystole.  He received 2 mg of Narcan and 4 rounds of epinephrine per chart review.  ROSC was reported to be achieved at 25 minutes.  He was intubated in the field and brought to [facility] emergency room and admitted to the ICU.  He is on pressors due to hypotension. Initially hypothermic, now temperatures 101.  His initial exam prior critical care team was he was completely unresponsive with no doll's, no corneal reflex.  Shortly after he started having facial twitching which corresponding spread to include bilateral upper arms.  Patient was started on Keppra and  Versed drip (1mg /hr) neurology was consulted.  CT scan ordered, however not done as patient apparently to unstable from cardio respiratory standpoint to be transported..  On assessment had rhythmic facial twitching.  Left eye and gaze deviation to the left. Patient having no spontaneous movements, however breathing over the vent. UDS + for cocaine, opiates and THC.      ROS: Unable to obtain due to altered mental status.   History reviewed. No pertinent past medical history.  No family history on file. Unable to obtain as patient in unresponsive. Unlikely pertinent to current presentation   Social History:  reports that he has been smoking.  He has never used smokeless tobacco. He reports that he drinks alcohol. He  reports that he has current or past drug history. Drug: Marijuana.  Used heroin   Exam: Current vital signs: BP (!) 103/58 (BP Location: Left Arm)   Pulse (!) 119   Temp (!) 101.7 F (38.7 C)   Resp (!) 36   Ht 6' (1.829 m)   Wt 86.2 kg (190 lb) Comment: from Jan 2019 records  SpO2 (!) 75%   BMI 25.77 kg/m  Vital signs in last 24 hours: Temp:  [86.7 F (30.4 C)-102.6 F (39.2 C)] 101.7 F (38.7 C) (06/06 0000) Pulse Rate:  [59-141] 119 (06/06 0000) Resp:  [6-36] 36 (06/06 0000) BP: (58-159)/(24-130) 103/58 (06/06 0000) SpO2:  [75 %-100 %] 75 % (06/06 0000) Arterial Line BP: (63-129)/(35-83) 95/83 (06/05 1900) FiO2 (%):  [100 %] 100 % (06/06 0000) Weight:  [86.2 kg (190 lb)] 86.2 kg (190 lb) (06/05 0610)   Physical Exam  Constitutional: Appears well-developed and well-nourished.  Psych: Affect appropriate to situation Eyes: No scleral injection HENT: No OP obstrucion Head: Normocephalic.  Cardiovascular: Normal rate and regular rhythm.  Respiratory: Effort normal, non-labored breathing GI: Soft.  No distension. There is no tenderness.  Skin: WDI  Neuro: Mental Status: Patient does not respond to verbal stimuli.  Does not respond to deep sternal rub.  Does not follow commands.  No verbalizations noted.  Cranial Nerves: II:  pupils right 3 mm, sluggish mm, left 3 mm nonreactive III,IV,VI: doll's response absent bilaterally.  Cold caloric responses not tested V,VII: corneal reflex: Absent VIII: patient does not respond to verbal stimuli IX,X:  gag reflex absent XI: trapezius strength unable to test bilaterally XII: tongue strength unable to test Motor: Extremities flaccid throughout.  No spontaneous movement noted.  No purposeful movements noted.  Sensory: Does not respond to noxious stimuli in any extremity. Deep Tendon Reflexes:  Absent throughout. Plantars: Mute Cerebellar: Unable to perform     I have reviewed labs in epic and the results pertinent to  this consultation.  I have reviewed the images obtained:Ct head   Impression  Suspected severe anoxic injury  Myoclonic status epilpeticus  - myoclonus within 24 hrs of cardiac arrest is a very strong indicator of very poor prognosis - stat EEG suggestive of myoclonic status, official read pending - patient started on Keppra, Versed drip 5mg /hr,  -  CT head for prognosis -questionable loss of gray/white matter differentiation.  Recommendation Continue to monitor clinically for improvement, will be difficult as he is on versed drip. Switching to propofol may be difficult due to severe hypotension.  MRI brian to prognosticate May consider repeating EEG tomorrow in the morning If myoclonic activity persists and is severe, consider starting VPA  Ammonia and liver enzymes ordered. Palliative care consult and goals of care discussion      This patient is neurologically critically ill due to anoxic injury. He is at risk for significant risk of neurological worsening from cerebral edema,  death from brain herniation, heart failure, nfection, respiratory failure and seizures/status epilepticus. This patient's care requires constant monitoring of vital signs, hemodynamics, respiratory and cardiac monitoring, review of multiple databases, neurological assessment, discussion with family, other specialists and medical decision making of high complexity.  I spent  50 minutes of neurocritical time in the care of this patient.

## 2017-08-27 NOTE — Progress Notes (Signed)
Time of death 1518. Verified by myself and April Lewis RN.   175 mL of Versed wasted in the sink with April Lewis RN.

## 2017-08-27 NOTE — Progress Notes (Signed)
eLink Physician-Brief Progress Note Patient Name: George SpanielJames W Redd DOB: 1970-02-29 MRN: [id number]   Date of Service  07/28/2017  HPI/Events of Note  Fever  eICU Interventions  PRN rectal tylenol 650 mg        [place]wo U Ogan 08/12/2017, 2:09 AM

## 2017-08-27 NOTE — Progress Notes (Signed)
Nutrition Brief Note  Chart reviewed due to vent status. Patient has a very poor prognosis.  No nutrition interventions warranted at this time.  Please consult as needed.   George CourtsKimberly Harris, RD, LDN, CNSC Pager [phone] After Hours Pager [phone]

## 2017-08-27 NOTE — Progress Notes (Signed)
Stat EEG completed at bedside.  Viewed by Dr Laurence SlateAroor.  Results pending.

## 2017-08-27 NOTE — Progress Notes (Signed)
Transported patient to CT scan with no events to report.  Patient now back in Room 56M-14.

## 2017-08-27 NOTE — Progress Notes (Signed)
Responded to unit page to support family and friends at bedside. Patient deceased. Provided information sharing between family and staff. Family still at bedside. Chaplain available as needed.   01/10/18 1555  Clinical Encounter Type  Visited With Patient and family together;Health care provider  Visit Type Initial;Spiritual support;Death  Referral From Nurse  Spiritual Encounters  Spiritual Needs Emotional;Grief support  Stress Factors  Family Stress Factors Loss  Fae PippinWatlington, Ova Meegan, Chaplain, [facility], Pager [phone]

## 2017-08-27 NOTE — Progress Notes (Addendum)
Neurohospitalist Service follow up assessment  BP 101/71   Pulse (!) 39   Temp (!) 102.4 F (39.1 C)   Resp (!) 41   Ht 6' (1.829 m)   Wt 98 kg (216 lb 0.8 oz)   SpO2 (!) 79%   BMI 29.30 kg/m    Ment: On light sedation with Versed at a rate of 3 mg/hr. GCS = 3 with no responses to any external stimuli CN: Pupils unreactive. No blink to eyelash stimulation. No doll's eye reflex. Eyes mildly dysconjugate.  Motor/Sensory: Flaccid tone x 4. No movement to noxious stimuli Other: No myoclonic activity or other adventitious movements noted  A/R: 47 year old male with probable anoxic brain injury 1. Myoclonic status is clinically resolved 2. Overall clinical picture suggests a poor prognosis 3. Obtain MRI brain when able.  4. Repeat Neurological exam in 60 hours (3 days following presentation) for more definitive prognostication. Will need to be off all sedation for 24 hours prior to prognostic exam  Addendum: Dr. Amada JupiterKirkpatrick has reviewed the EEG. He has recommended further EEG monitoring and sedation with burst suppression x 24 hours. LTM EEG has been ordered. Will load Versed 0.2 mg/kg and increase rate to 0.2 mg/kg/hr  Electronically signed: Dr. Caryl PinaEric Wahneta Derocher

## 2017-08-27 NOTE — Progress Notes (Signed)
PULMONARY / CRITICAL CARE MEDICINE   Name: George Costa MRN: [id number] DOB: 1970/09/20    ADMISSION DATE:  08/08/2017 CONSULTATION DATE:  08/04/2017  REFERRING MD:  Judd Lienelo - EDP  CHIEF COMPLAINT:  Cardiac Arrest  HISTORY OF PRESENT ILLNESS:  Pt is encephelopathic; therefore, this HPI is obtained from chart review. George Costa is a 47 y.o. male with no known Costa.  He was at a hotel night of 6/4.  Girlfriend woke up around 430AM and found him unresponsive, unknown for how long.  He had possible heroin use the night prior.  EMS was called and found pt in asystole.  He received 2mg  narcan and 4 rounds epi before starting an epi infusion.  ROSC was then achieved after reportedly ~ 25 minutes.  He was intubated in field and [facility] where [facility] remained unresponsive.  He also had hypotension so was started on neosynephrine.  Head CT currently still pending.  SUBJECTIVE:  Active seizures noted on EEG late 6/5, was placed on midazolam infusion. He continues to require maximum doses of norepinephrine, phenylephrine, stable dose vasopressin CT scan of the head performed overnight as below   VITAL SIGNS: BP (!) 77/38   Pulse (!) 112   Temp (!) 102.4 F (39.1 C)   Resp (!) 26   Ht 6' (1.829 m)   Wt 98 kg (216 lb 0.8 oz)   SpO2 (!) 79%   BMI 29.30 kg/m    HEMODYNAMICS: CVP:  [13 mmHg-21 mmHg] 18 mmHg  VENTILATOR SETTINGS: Vent Mode: PCV FiO2 (%):  [100 %] 100 % Set Rate:  [16 bmp] 16 bmp PEEP:  [18 cmH20] 18 cmH20 Plateau Pressure:  [23 cmH20-33 cmH20] 33 cmH20  INTAKE / OUTPUT: I/O last 3 completed shifts: In: 13244.2 [I.V.:12394.2; IV Piggyback:850] Out: 950 [Urine:250; Emesis/NG output:700]   PHYSICAL EXAMINATION: General: Age male on full ventilatory support overbreathing vent, asynchronous with the vent HEENT: Tracheal tube in place, pupils pinpoint, negative doll's eyes, overbreathing the vent. Neuro: Nonresponsive to noxious stimuli, CV: ,Heart sounds are  distant, 3 pressor support PULM: Coarse rhonchi bilaterally GI: No bowel sounds, distended Extremities: warm/dry, 1+ edema  Skin: no rashes or lesions     LABS:  BMET Recent Labs  Lab 08/04/2017 0545 Nov 10, 2017 0215  NA 144 144  K 4.5 4.1  CL 104 101  CO2 18* 23  BUN 37* 50*  CREATININE 4.86* 5.26*  GLUCOSE 85 87    Electrolytes Recent Labs  Lab 08/24/2017 0545 Nov 10, 2017 0215  CALCIUM 7.6* 5.1*  MG  --  1.4*  PHOS  --  6.7*    CBC Recent Labs  Lab 07/31/2017 0545 Nov 10, 2017 0235  WBC 7.3 3.5*  HGB 17.2* 16.0  HCT 55.5* 48.4  PLT 231 167    Coag's No results for input(s): APTT, INR in the last 168 hours.  Sepsis Markers Recent Labs  Lab 07/29/2017 0553 08/06/2017 0900 08/26/2017 1200  LATICACIDVEN 12.29* 7.8* 5.5*    ABG Recent Labs  Lab 07/31/2017 1044 08/05/2017 1148 Nov 10, 2017 0350  PHART 7.313* 7.335* 7.243*  PCO2ART 49.5* 49.2* 54.9*  PO2ART 37.0* 39.0* 70.0*    Liver Enzymes Recent Labs  Lab Nov 10, 2017 0215  AST 674*  ALT 307*  ALKPHOS 47  BILITOT 0.7  ALBUMIN 2.1*    Cardiac Enzymes Recent Labs  Lab 08/17/2017 1002 08/07/2017 1600  TROPONINI 3.03* 20.44*    Glucose Recent Labs  Lab 08/13/2017 1116 Nov 10, 2017 0040 Nov 10, 2017 0100 Nov 10, 2017 0446 Nov 10, 2017 0518 Nov 10, 2017  0824  GLUCAP 168* 63* 116* 40* 118* 62*    Imaging Ct Head Wo Contrast  Result Date: 2017/08/07 CLINICAL DATA:  Status post cardiac arrest. Assess for anoxic brain injury. EXAM: CT HEAD WITHOUT CONTRAST TECHNIQUE: Contiguous axial images were obtained from the base of the skull through the vertex without intravenous contrast. COMPARISON:  None. FINDINGS: Brain: There is perhaps slightly decreased gray-white differentiation at the medial temporal lobes bilaterally. No additional evidence of acute infarction, hemorrhage, hydrocephalus, extra-axial collection or mass lesion/mass effect. The posterior fossa, including the cerebellum, brainstem and fourth ventricle, is within normal  limits. The third and lateral ventricles, and basal ganglia are unremarkable in appearance. The cerebral hemispheres are symmetric in appearance, with normal gray-white differentiation. No mass effect or midline shift is seen. Vascular: No hyperdense vessel or unexpected calcification. Skull: There is no evidence of fracture; visualized osseous structures are unremarkable in appearance. Sinuses/Orbits: The visualized portions of the orbits are within normal limits. Mild mucosal thickening is noted at the right maxillary sinus. The remaining paranasal sinuses and mastoid air cells are well-aerated. Other: No significant soft tissue abnormalities are seen. IMPRESSION: 1. Question of slightly decreased gray-white differentiation at the medial temporal lobes bilaterally. Would correlate clinically for evidence of anoxic brain injury. Otherwise unremarkable CT of the head. 2. Mild mucosal thickening at the right maxillary sinus. Electronically Signed   By: Roanna Raider M.D.   On: 08/07/17 03:30   Dg Chest Port 1 View  Result Date: 2017-08-07 CLINICAL DATA:  Shortness of Breath EXAM: PORTABLE CHEST 1 VIEW COMPARISON:  07/29/2017 FINDINGS: Cardiac shadow is stable. The lungs are well aerated bilaterally. Increasing infiltrates are noted bilaterally left greater than right when compare with the prior exam. Possibility of some superimposed pulmonary edema deserves consideration. Right jugular central line, endotracheal tube and nasogastric catheter are again seen and stable. IMPRESSION: Increasing bilateral infiltrative changes as described. Electronically Signed   By: Alcide Clever M.D.   On: 2017/08/07 08:59     STUDIES:  CT head 6/6 >> slight decrease of the gray-white junction in the medial temporal lobes bilaterally, no evidence of acute focal infarct or hemorrhage, suggestive of anoxic injury CXR 6/5 > endotracheal tube central line in good position  CULTURES: Blood 6/5 > pneumococcus >>  Sputum 6/5 >   Urine 6/5 >   ANTIBIOTICS: Vancomycin 6/5 Zosyn 6/5  SIGNIFICANT EVENTS: 6/5 > admit.  LINES/TUBES: ETT 6/5 >  CVL right IJ 6/5 > Art line left radial 6/5 >  Right femoral arterial line 08/26/2017>>  DISCUSSION: 47 y.o. male with no Costa, brought to [facility] ED 6/5 after being found down at hotel.  Possible heroin use.  Unknown downtime but ~ 25 minutes with EMS working on him prior to ROSC.  Remained unresponsive in ED, started on pressors for persistent hypotension.  08/19/2017 he is proven refractory to treatments due to his severe metabolic acidosis hypoxic respiratory failure and anoxic injury he may not survive.  ASSESSMENT / PLAN:  PULMONARY A: Acute respiratory failure, multifactorial Diffuse bilateral infiltrates, consistent with ARDS Possible aspiration pneumonia P:   Ventilator support with low tidal volumes per ARDS protocol Titrate PEEP and FiO2 Follow chest x-ray Antibiotics as below for presumed aspiration pneumonia Even if he has some degree of pulmonary improvement in his mental status will almost certainly preclude extubation  CARDIOVASCULAR A:  Cardiac Arrest - presumed due to heroin overdose. Shock -multifactorial.  Cardiogenic, hypovolemic, septic Stress non-ST elevation MI Possible endocarditis given history of  IV drug use and positive blood cultures P:  Pressors have been maximized, Levophed, phenylephrine, dopamine Attempt to correct acidosis to assist with pressor efficacy Echocardiogram pending   RENAL A:   Acute renal failure, ATN due to hypoperfusion injury Anion gap metabolic acidosis due to lactate, hypoperfusion, ATN P:   Currently on bicarbonate drip Given his mental status, the likelihood of a severe brain injury I do not believe he is a candidate for hemodialysis.   GASTROINTESTINAL A:   GI prophylaxis. Nutrition. P:  Pepcid as ordered Defer nutrition for now As we work to determine goals for care  HEMATOLOGIC A:   VTE  Prophylaxis. P:  SCDs in place Defer heparin  INFECTIOUS A:   Pneumococcal bacteremia, at risk endocarditis P:   Continue Zosyn, stop vancomycin on 6/6 Follow cultures for completion  ENDOCRINE A:   No acute issues. P:   Continue sliding scale insulin coverage  NEUROLOGIC A:   Acute encephalopathy -most consistent with an anoxic injury given exam, CT scan of the head Polysubstance abuse Acute seizures due to the above P:   EEG shows that seizure activity no longer occurring on midazolam drip Appreciate neurology input. Prognosis here is dismal based on his exam, head CT, seizures.  This has been explained to his family  Family updated: no family present currently, reviewed status with sister, multiple family and friends on 6/5  Interdisciplinary Family Meeting v Palliative Care Meeting:  Due by: 08/07/17.  independent critical care time 33 minutes  Levy Pupa, MD, PhD 08-06-17, 11:21 AM [facility] [phone] or if no answer [phone]

## 2017-08-27 NOTE — Progress Notes (Signed)
vLTM EEG running/ no skin breakdown/ notified Neuro

## 2017-08-27 NOTE — Procedures (Signed)
  Video EEG Monitoring Report    Dates of monitoring:   08/02/16 @09 :58 to 08/02/16 @17 :30  Recording day:    1 (started on 6/6) Requesting provider:   Agnes LawrenceE. Lindzen Interpreting physician:  Wynelle Bourgeoisan-Andrei Augustina Braddock, MD  CPT:  [phone]  ICD-10:  I46.9   Present History: 47 year old s/p cardiac arrest with myoclonus status.   EEG Classification  Abnormal, Coma    There is no PDR  HR  90 bpm and regular irregularly irregular    Background abnormalities:   1. Continuous slow --> Background suppression, generalized    Periodic, rhythmic or epileptiform abnormalities:   none   Ictal phenomena:  none    EEG DETAILS:  TYPE OF RECORDING: At least 18-channel digital EEG with using a standard international 10-20 placement with additional EEG electrodes, and 1 additional channel dedicated to the EKG, at a sampling rate of at least 256 Hz. Video was recorded throughout the study. The recording was interpreted using digital review software allowing for montage reformatting, gain and filter changes. Each page was reviewed manually. Automatic spike and seizure detection software and quantitative analysis tools were used as needed.   Description of EEG features: The recording reveals a  continuous, non variable and non reactive symmetric background.  This consists of diffuse, medium amplitude very slow delta dominant activity with overlaying monomorphic diffuse theta. Starting at ~15:00 the overal amplitude starts decreasing and the EEG shows background suppression from about 15:20. The EKG shows no EKG activity after 15:16.    Interpretation: The initial EEG shows diffuse severe encephalopathy, non-specific as to etiology. After 15:20 there is electrocerebral silence. EEG is discontinued because the patient passed away.

## 2017-08-27 NOTE — Progress Notes (Signed)
vLTM EEG complete. Pt deceased. Prolonged > 1hr

## 2017-08-27 NOTE — Death Summary Note (Signed)
DEATH SUMMARY   Patient Details  Name: George Costa MRN: [id number] DOB: [birth year]  Admission/Discharge Information   Admit Date:  2017-08-26  Date of Death: Date of Death: August 27, 2017  Time of Death: Time of Death: 07/29/1516  Length of Stay: 1  Referring Physician: Lenice Llamas, NP   Reason(s) for Hospitalization  Cardiac arrest  Diagnoses  Preliminary cause of death:  Anoxic encephalopathy Secondary Diagnoses (including complications and co-morbidities):  Principal Problem:   Cardiac arrest [facility]) Active Problems:   Anoxic encephalopathy (HCC)   Septic shock (HCC)   Acute respiratory failure (HCC)   Heroin overdose (HCC)   Refractory shock (HCC)   ARDS (adult respiratory distress syndrome) (HCC)   Non-ST elevation MI (NSTEMI) (HCC)   Acute renal failure (ARF) (HCC)   High anion gap metabolic acidosis   Pneumococcal bacteremia   Polysubstance abuse (HCC)   Seizure in response to acute event [facility]) 6/6  Brief Hospital Course (including significant findings, care, treatment, and services provided and events leading to death)  George Costa is a 47 y.o. year old male polysubstance abuser brought to [facility] ED 6/5 after being found down at hotel.    Probable heroin use.  Unknown downtime but ~ 25 minutes with EMS working on him prior to ROSC.  Remained unresponsive in ED, started on pressors for persistent hypotension.    Initial course complicated by seizure activity which resolved with sedation.  Head CT and physical exam consistent with a severe anoxic brain injury.  He experienced progressive renal failure and refractory shock.  Cultures grew out a pneumococcal bacteremia consistent with his possible IV drug abuse.  He was initially treated with Zosyn and vancomycin, this was narrowed to Zosyn alone after culture data obtained.  Unfortunately he continued to have refractory metabolic acidosis, shock.  He showed evidence of a severe brain injury due to anoxia.  Despite all aggressive  support his blood pressure would not stabilize and he remained acidotic.  He lost a pulse and expired Aug 27, 2017.    Pertinent Labs and Studies  Significant Diagnostic Studies Ct Head Wo Contrast  Result Date: 08/27/2017 CLINICAL DATA:  Status post cardiac arrest. Assess for anoxic brain injury. EXAM: CT HEAD WITHOUT CONTRAST TECHNIQUE: Contiguous axial images were obtained from the base of the skull through the vertex without intravenous contrast. COMPARISON:  None. FINDINGS: Brain: There is perhaps slightly decreased gray-white differentiation at the medial temporal lobes bilaterally. No additional evidence of acute infarction, hemorrhage, hydrocephalus, extra-axial collection or mass lesion/mass effect. The posterior fossa, including the cerebellum, brainstem and fourth ventricle, is within normal limits. The third and lateral ventricles, and basal ganglia are unremarkable in appearance. The cerebral hemispheres are symmetric in appearance, with normal gray-white differentiation. No mass effect or midline shift is seen. Vascular: No hyperdense vessel or unexpected calcification. Skull: There is no evidence of fracture; visualized osseous structures are unremarkable in appearance. Sinuses/Orbits: The visualized portions of the orbits are within normal limits. Mild mucosal thickening is noted at the right maxillary sinus. The remaining paranasal sinuses and mastoid air cells are well-aerated. Other: No significant soft tissue abnormalities are seen. IMPRESSION: 1. Question of slightly decreased gray-white differentiation at the medial temporal lobes bilaterally. Would correlate clinically for evidence of anoxic brain injury. Otherwise unremarkable CT of the head. 2. Mild mucosal thickening at the right maxillary sinus. Electronically Signed   By: Roanna Raider M.D.   On: 2017-08-27 03:30   Dg Chest Port 1 View  Result Date:  08/25/2017 CLINICAL DATA:  Shortness of Breath EXAM: PORTABLE CHEST 1 VIEW  COMPARISON:  08/09/2017 FINDINGS: Cardiac shadow is stable. The lungs are well aerated bilaterally. Increasing infiltrates are noted bilaterally left greater than right when compare with the prior exam. Possibility of some superimposed pulmonary edema deserves consideration. Right jugular central line, endotracheal tube and nasogastric catheter are again seen and stable. IMPRESSION: Increasing bilateral infiltrative changes as described. Electronically Signed   By: Alcide CleverMark  Lukens M.D.   On: 02-13-2018 08:59   Dg Chest Portable 1 View  Result Date: 08/03/2017 CLINICAL DATA:  Central catheter placement EXAM: PORTABLE CHEST 1 VIEW COMPARISON:  August 01, 2017 study obtained earlier in the day FINDINGS: Central catheter tip is in the superior vena cava. Endotracheal tube tip is 7.4 cm above carina. Nasogastric tube tip and side port are in the stomach. No pneumothorax. There is patchy airspace consolidation in both mid lung regions. Lungs elsewhere clear. Heart size and pulmonary vascularity are normal. No adenopathy. No bone lesions. IMPRESSION: Tube and catheter positions as described without pneumothorax. Airspace consolidation felt to represent pneumonia in both mid lung regions, somewhat more on the left than on the right. Lungs elsewhere clear. Heart size normal. Electronically Signed   By: Bretta BangWilliam  [facility] III M.D.   On: 08/04/2017 07:21   Dg Chest Portable 1 View  Result Date: 08/15/2017 CLINICAL DATA:  Endotracheal tube placement. EXAM: PORTABLE CHEST 1 VIEW COMPARISON:  Chest radiograph performed 06/08/2015 FINDINGS: The patient's endotracheal tube is seen ending 9 cm above the carina. An enteric tube is noted extending below the diaphragm. Right infrahilar and left midlung airspace opacification is concerning for multifocal pneumonia. The lung bases are incompletely imaged on this study. No significant pleural effusion or pneumothorax is seen. The cardiomediastinal silhouette is normal in size. No acute  osseous abnormalities are identified. IMPRESSION: 1. Endotracheal tube seen ending 9 cm above the carina. This could be advanced 5-6 cm. 2. Right infrahilar and left midlung airspace opacification is concerning for multifocal pneumonia. Electronically Signed   By: Roanna RaiderJeffery  Chang M.D.   On: 08/23/2017 06:28    Microbiology No results found for this or any previous visit (from the past 240 hour(s)).  Lab Basic Metabolic Panel: No results for input(s): NA, K, CL, CO2, GLUCOSE, BUN, CREATININE, CALCIUM, MG, PHOS in the last 168 hours. Liver Function Tests: No results for input(s): AST, ALT, ALKPHOS, BILITOT, PROT, ALBUMIN in the last 168 hours. No results for input(s): LIPASE, AMYLASE in the last 168 hours. No results for input(s): AMMONIA in the last 168 hours. CBC: No results for input(s): WBC, NEUTROABS, HGB, HCT, MCV, PLT in the last 168 hours. Cardiac Enzymes: No results for input(s): CKTOTAL, CKMB, CKMBINDEX, TROPONINI in the last 168 hours. Sepsis Labs: No results for input(s): PROCALCITON, WBC, LATICACIDVEN in the last 168 hours.  Procedures/Operations   ETT 6/5 >  CVL right IJ 6/5 > Art line left radial 6/5 >  Right femoral arterial line 08/14/2017>>  Les PouRobert S Gerrit Rafalski 08/23/2017, 1:04 PM

## 2017-08-27 NOTE — Procedures (Signed)
ELECTROENCEPHALOGRAM REPORT  Date of Study: November 15, 2017 (preliminary read done by Dr. Laurence SlateAroor during time of study)  Patient's Name: George Costa MRN: [id number] Date of Birth: 02-13-1971  Referring Provider: Dr. Georgiana SpinnerSushanth Aroor  Clinical History: This is a 47 year old man s/p cardiac arrest with myoclonic jerking  Medications: Keppra Versed (last dose 2 hours prior to EEG)  Technical Summary: A multichannel digital EEG recording measured by the international 10-20 system with electrodes applied with paste and impedances below 5000 ohms performed as portable with EKG monitoring in an intubated and unresponsive patient.  Hyperventilation and photic stimulation were not performed.  The digital EEG was referentially recorded, reformatted, and digitally filtered in a variety of bipolar and referential montages for optimal display.   Description: The patient is intubated and unresponsive during the recording. There is no clear posterior dominant rhythm seen. The background is disorganized with a large amount of diffuse theta and delta slowing admixed with alpha and beta frequencies. There are lateralized periodic discharges seen over the left frontocentral region at a frequency of 1-3/sec without evolution in frequency or amplitude. Patient is noted to have head jerks every 1-2 seconds with movement artifact in the occipital regions, no associated epileptiform correlate. Normal sleep architecture is not seen. Hyperventilation and photic stimulation were not performed. There were no electrographic seizures seen.    EKG lead showed sinus tachycardia.  Impression: This EEG is abnormal due to the presence of: 1. Moderate diffuse background slowing 2. Lateralized periodic discharges over the left frontocentral region without evolution occurring at a frequency of 1-3/sec without evolution seen  Clinical Correlation of the above findings indicates diffuse cerebral dysfunction that is non-specific in  etiology and can be seen with hypoxic/ischemic injury, toxic/metabolic encephalopathies, or medication effect. Although lateralized periodic discharges do not represent ongoing seizure activity, they are commonly associated with an acute brain lesion and clinical focal seizures, or post-ictal after focal status epilepticus. There were no electrographic seizures in this study. Clinical correlation is advised.   George Costa, M.D.

## 2017-08-27 DEATH — deceased

## 2019-01-04 IMAGING — CT CT HEAD W/O CM
4 series · 15 of 47 positions shown, 17 images · non-contrast
Comparison: None.

CLINICAL DATA: Status post cardiac arrest. Assess for anoxic brain
injury.

EXAM:
CT HEAD WITHOUT CONTRAST
TECHNIQUE: Contiguous axial images were obtained from the base of the skull
through the vertex without intravenous contrast.

[Series 3: head without · axial · non-contrast · 0.43mm/px · z∈[-192,-72]mm · 7 of 34 slices shown, 9 images]
[im 5/34  brain]
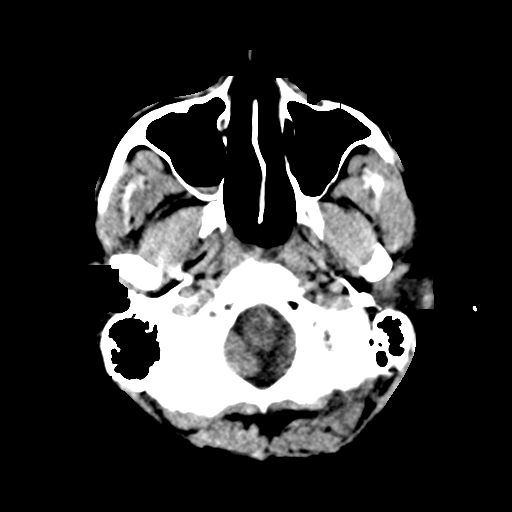
[im 5/34  bone]
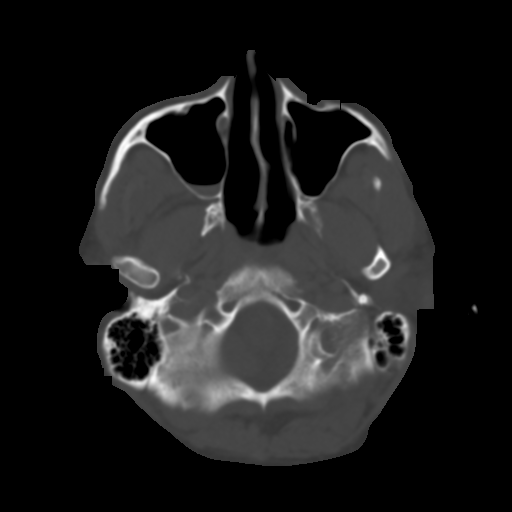
[im 9/34  brain]
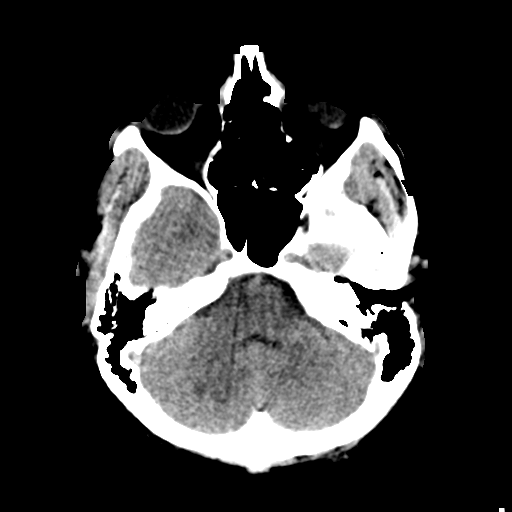
[im 13/34  brain]
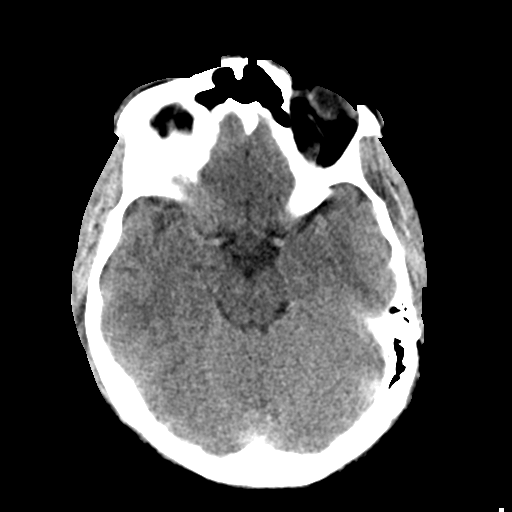
[im 17/34  brain]
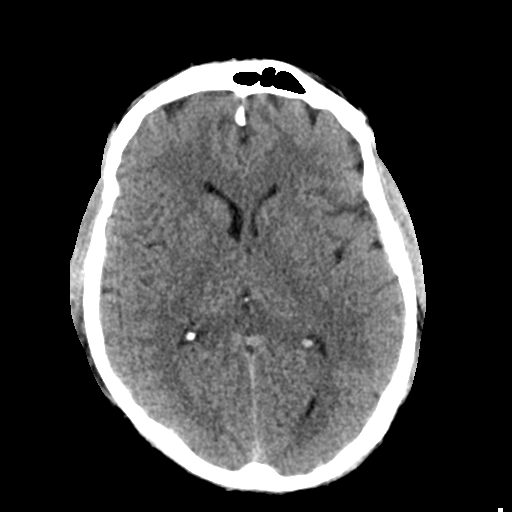
[im 21/34  brain]
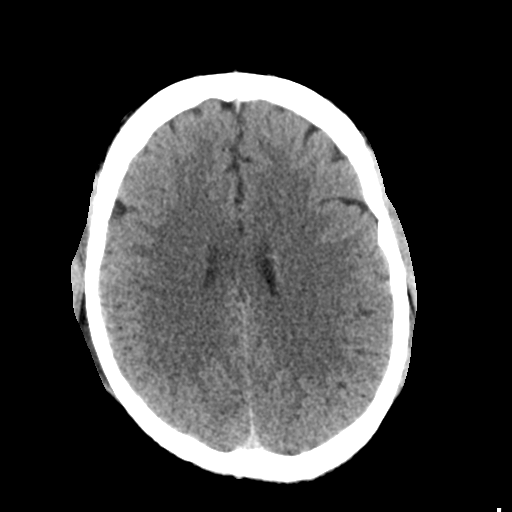
[im 21/34  bone]
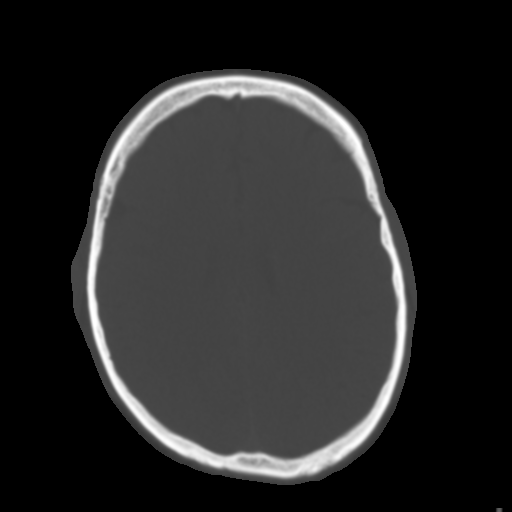
[im 25/34  brain]
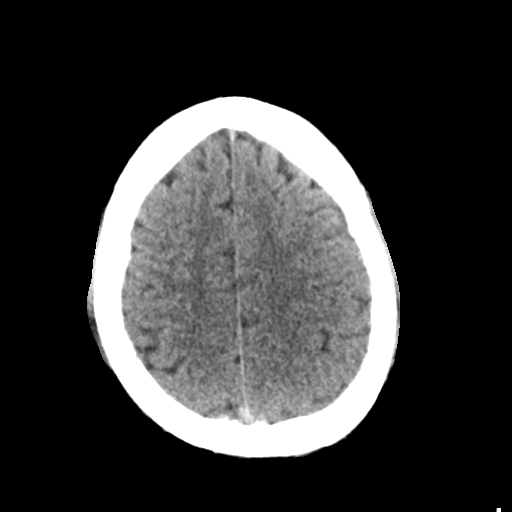
[im 29/34  brain]
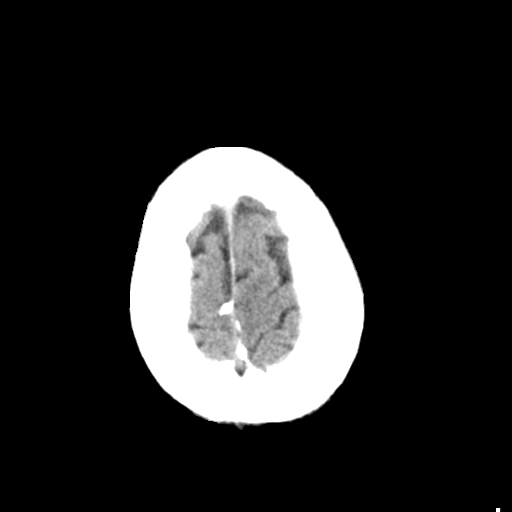

[Series 4: head bone · axial · 0.43mm/px · z∈[-196,-180]mm · 2 of 84 slices shown]
[im 9/84  bone]
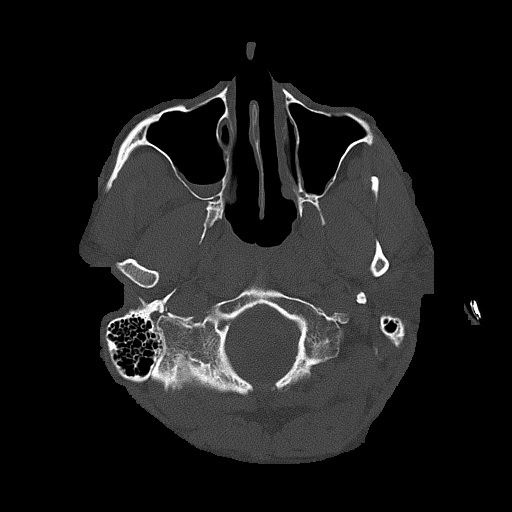
[im 17/84  bone]
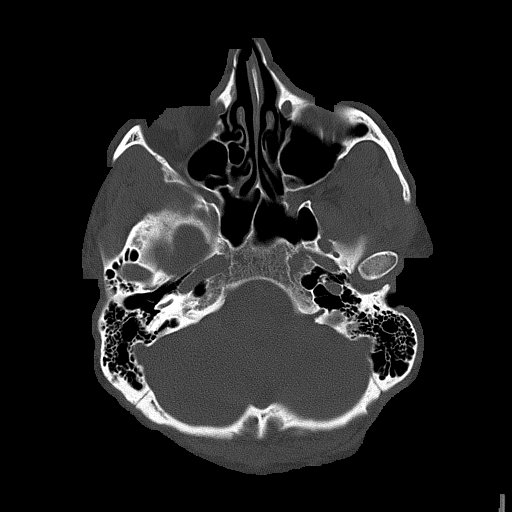

[Series 5: head without cor · coronal · non-contrast · 0.33mm/px · 3 of 75 slices shown]
[im 25/75  brain]
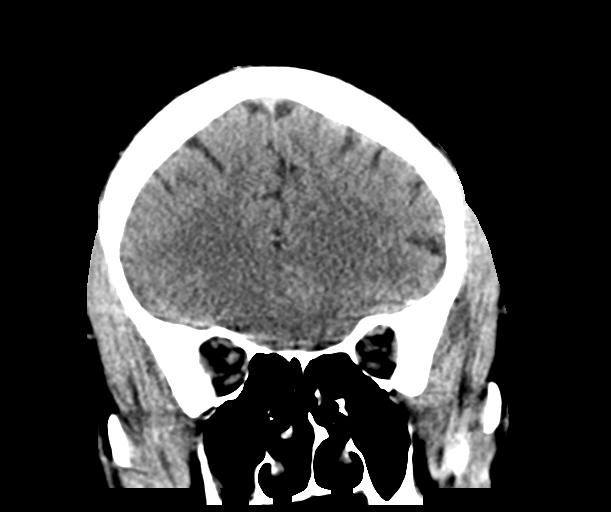
[im 33/75  brain]
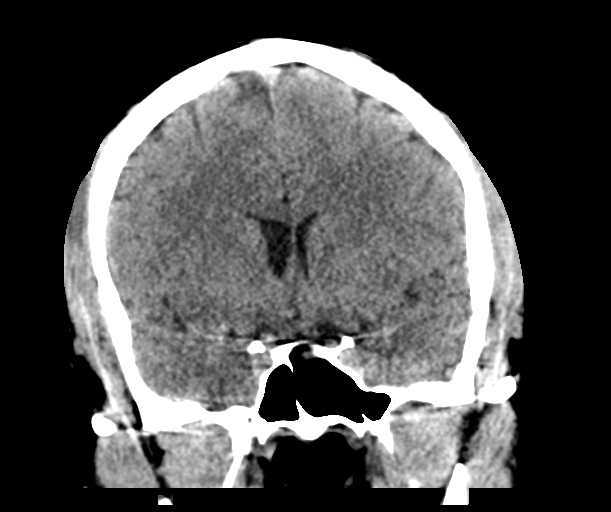
[im 42/75  brain]
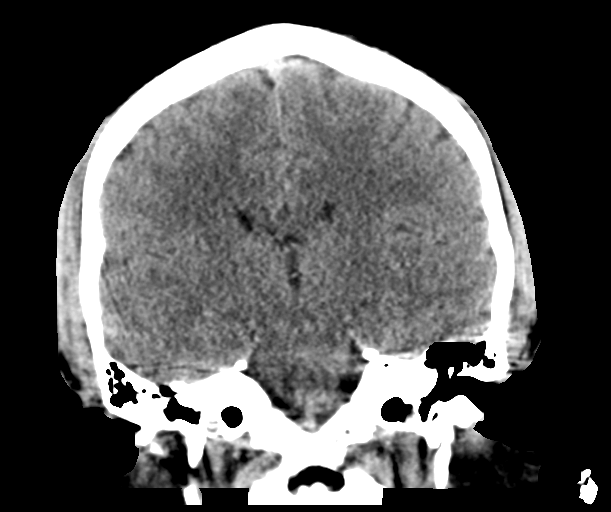

[Series 6: head without sag · sagittal · non-contrast · 0.35mm/px · 3 of 61 slices shown]
[im 21/61  brain]
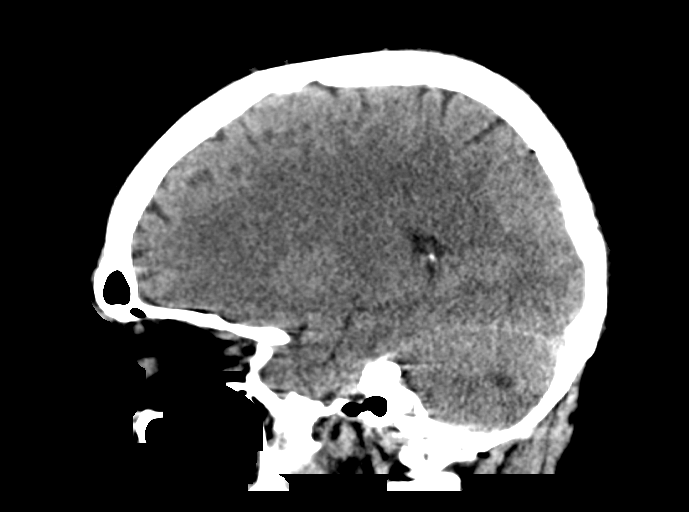
[im 31/61  brain]
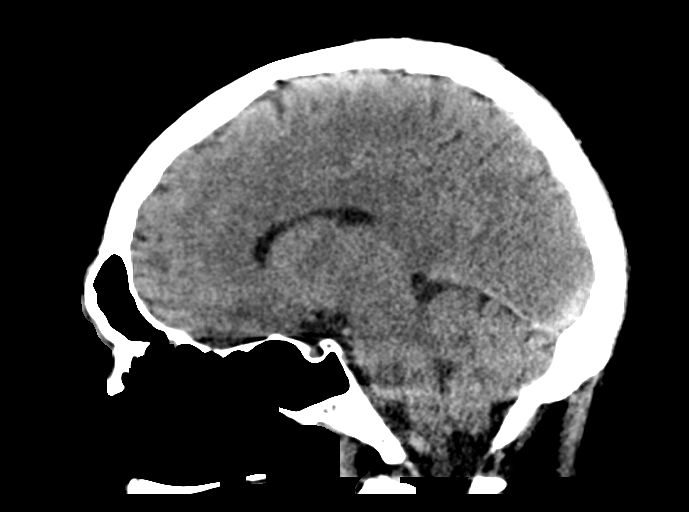
[im 41/61  brain]
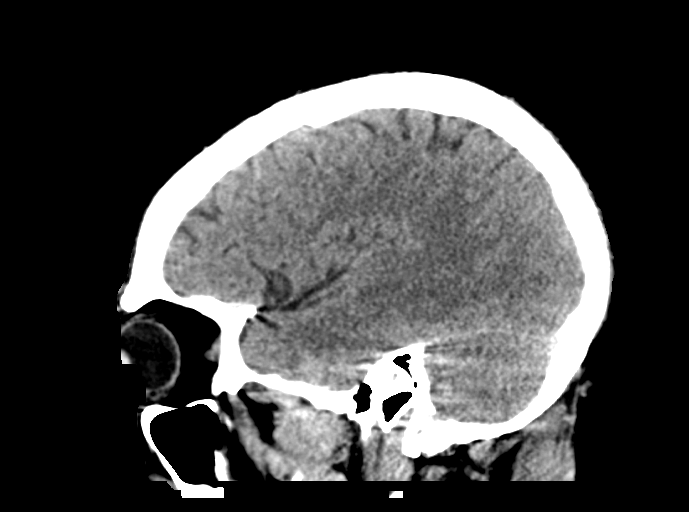

[15 of 47 positions shown; findings below may reference images not displayed]

FINDINGS: Brain: There is perhaps slightly decreased gray-white
differentiation at the medial temporal lobes bilaterally. No
additional evidence of acute infarction, hemorrhage, hydrocephalus,
extra-axial collection or mass lesion/mass effect.

The posterior fossa, including the cerebellum, brainstem and fourth
ventricle, is within normal limits. The third and lateral
ventricles, and basal ganglia are unremarkable in appearance. The
cerebral hemispheres are symmetric in appearance, with normal
gray-white differentiation. No mass effect or midline shift is seen.

Vascular: No hyperdense vessel or unexpected calcification.

Skull: There is no evidence of fracture; visualized osseous
structures are unremarkable in appearance.

Sinuses/Orbits: The visualized portions of the orbits are within
normal limits. Mild mucosal thickening is noted at the right
maxillary sinus. The remaining paranasal sinuses and mastoid air
cells are well-aerated.

Other: No significant soft tissue abnormalities are seen.
IMPRESSION: 1. Question of slightly decreased gray-white differentiation at the
medial temporal lobes bilaterally. Would correlate clinically for
evidence of anoxic brain injury. Otherwise unremarkable CT of the
head.
2. Mild mucosal thickening at the right maxillary sinus.
